# Patient Record
Sex: Female | Born: 1965 | Race: White | Hispanic: No | Marital: Married | Smoking: Never smoker
Health system: Southern US, Community
[De-identification: ages and names within clinical notes are randomized; demographics above are authoritative.]

## PROBLEM LIST (undated history)

## (undated) DIAGNOSIS — F419 Anxiety disorder, unspecified: Secondary | ICD-10-CM

---

## 2003-03-01 ENCOUNTER — Encounter: Admission: RE | Admit: 2003-03-01 | Discharge: 2003-03-01 | Payer: Self-pay | Admitting: Obstetrics & Gynecology

## 2005-10-01 ENCOUNTER — Encounter: Admission: RE | Admit: 2005-10-01 | Discharge: 2005-10-01 | Payer: Self-pay | Admitting: Obstetrics & Gynecology

## 2007-03-03 ENCOUNTER — Encounter: Admission: RE | Admit: 2007-03-03 | Discharge: 2007-03-03 | Payer: Self-pay | Admitting: Obstetrics and Gynecology

## 2007-03-09 ENCOUNTER — Encounter: Admission: RE | Admit: 2007-03-09 | Discharge: 2007-03-09 | Payer: Self-pay | Admitting: Obstetrics and Gynecology

## 2008-05-14 ENCOUNTER — Encounter: Admission: RE | Admit: 2008-05-14 | Discharge: 2008-05-14 | Payer: Self-pay | Admitting: Obstetrics & Gynecology

## 2010-01-26 ENCOUNTER — Encounter: Payer: Self-pay | Admitting: Obstetrics and Gynecology

## 2011-08-17 ENCOUNTER — Other Ambulatory Visit: Payer: Self-pay | Admitting: Obstetrics and Gynecology

## 2011-08-17 DIAGNOSIS — Z1231 Encounter for screening mammogram for malignant neoplasm of breast: Secondary | ICD-10-CM

## 2011-08-31 ENCOUNTER — Ambulatory Visit
Admission: RE | Admit: 2011-08-31 | Discharge: 2011-08-31 | Disposition: A | Discharge status: Home / Self Care | Payer: Private Health Insurance - Indemnity | Source: Ambulatory Visit | Attending: Obstetrics and Gynecology | Admitting: Obstetrics and Gynecology

## 2011-08-31 DIAGNOSIS — Z1231 Encounter for screening mammogram for malignant neoplasm of breast: Secondary | ICD-10-CM

## 2011-09-03 ENCOUNTER — Other Ambulatory Visit: Payer: Self-pay | Admitting: Obstetrics and Gynecology

## 2011-09-03 DIAGNOSIS — R928 Other abnormal and inconclusive findings on diagnostic imaging of breast: Secondary | ICD-10-CM

## 2011-09-09 ENCOUNTER — Ambulatory Visit
Admission: RE | Admit: 2011-09-09 | Discharge: 2011-09-09 | Disposition: A | Discharge status: Home / Self Care | Payer: Private Health Insurance - Indemnity | Source: Ambulatory Visit | Attending: Obstetrics and Gynecology | Admitting: Obstetrics and Gynecology

## 2011-09-09 DIAGNOSIS — R928 Other abnormal and inconclusive findings on diagnostic imaging of breast: Secondary | ICD-10-CM

## 2012-11-08 ENCOUNTER — Other Ambulatory Visit: Payer: Self-pay

## 2012-11-22 ENCOUNTER — Other Ambulatory Visit: Payer: Self-pay | Admitting: Obstetrics and Gynecology

## 2012-11-22 DIAGNOSIS — N6310 Unspecified lump in the right breast, unspecified quadrant: Secondary | ICD-10-CM

## 2012-12-12 ENCOUNTER — Other Ambulatory Visit: Payer: Private Health Insurance - Indemnity

## 2012-12-13 ENCOUNTER — Ambulatory Visit
Admission: RE | Admit: 2012-12-13 | Discharge: 2012-12-13 | Disposition: A | Discharge status: Home / Self Care | Payer: Private Health Insurance - Indemnity | Source: Ambulatory Visit | Attending: Obstetrics and Gynecology | Admitting: Obstetrics and Gynecology

## 2012-12-13 DIAGNOSIS — N6310 Unspecified lump in the right breast, unspecified quadrant: Secondary | ICD-10-CM

## 2014-12-22 IMAGING — US US BREAST*R*
1 series · 4 of 4 positions shown · non-contrast
Comparison: 08/31/2011, 05/14/2008, 03/03/2007

CLINICAL DATA: Palpable mass 12 o'clock position right breast

EXAM:
DIGITAL DIAGNOSTIC  BILATERAL MAMMOGRAM WITH CAD
ULTRASOUND RIGHT BREAST

[Series 1: us breast*right* · 4 of 4 slices shown]
[im 1/4]
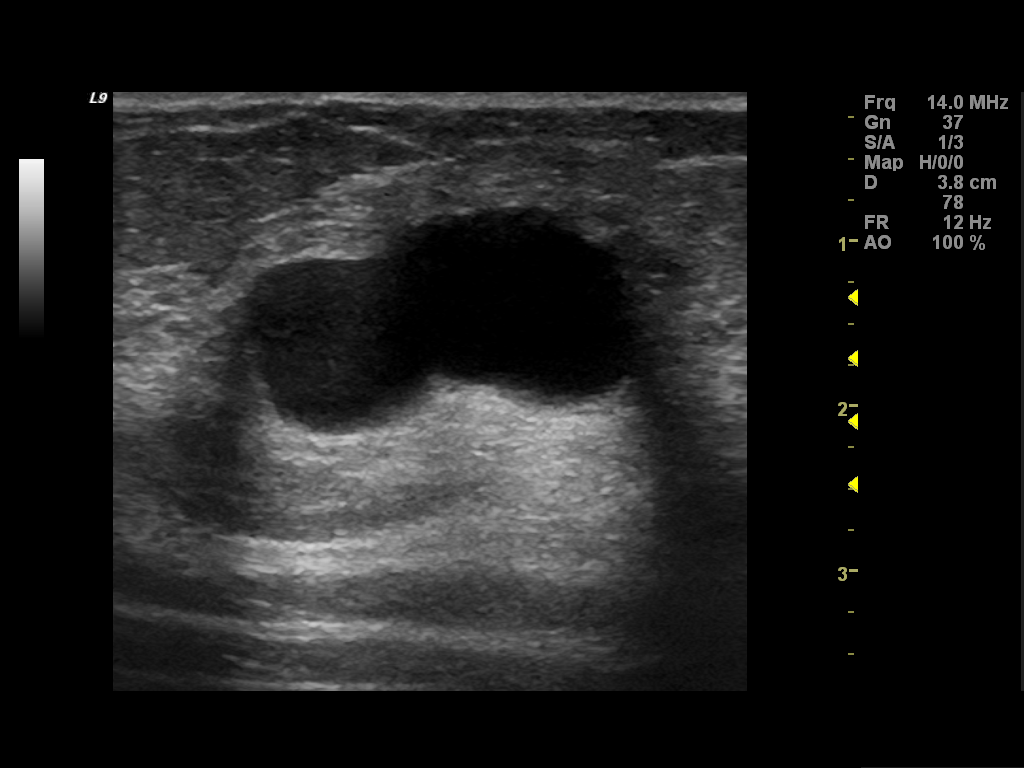
[im 2/4]
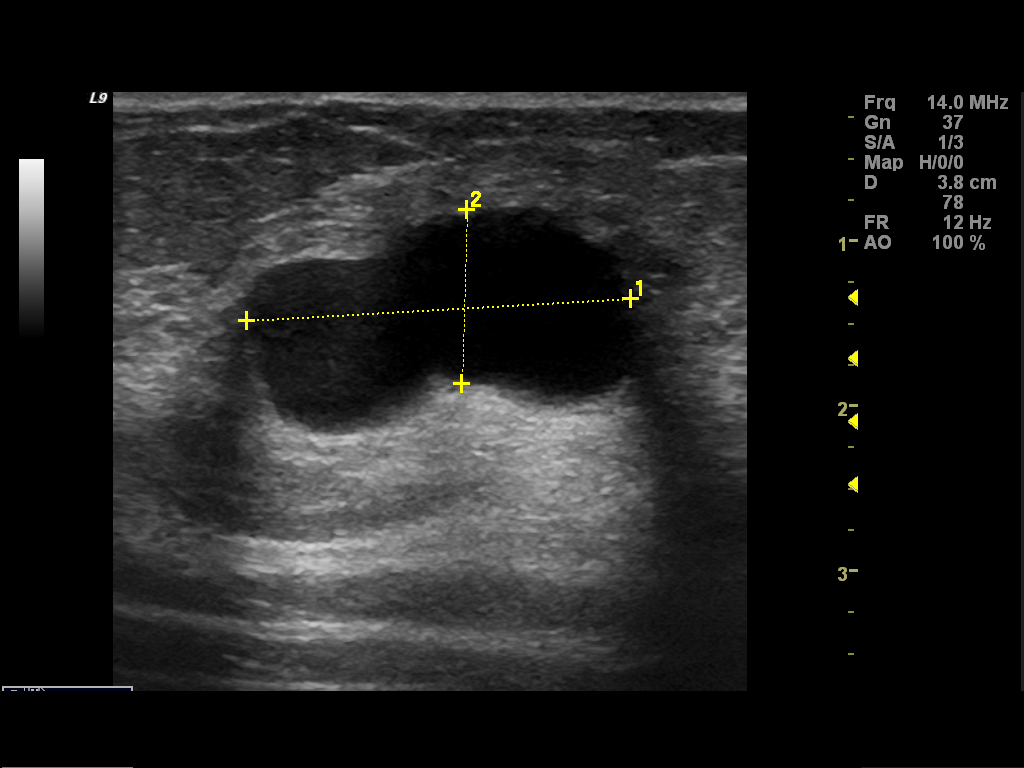
[im 3/4]
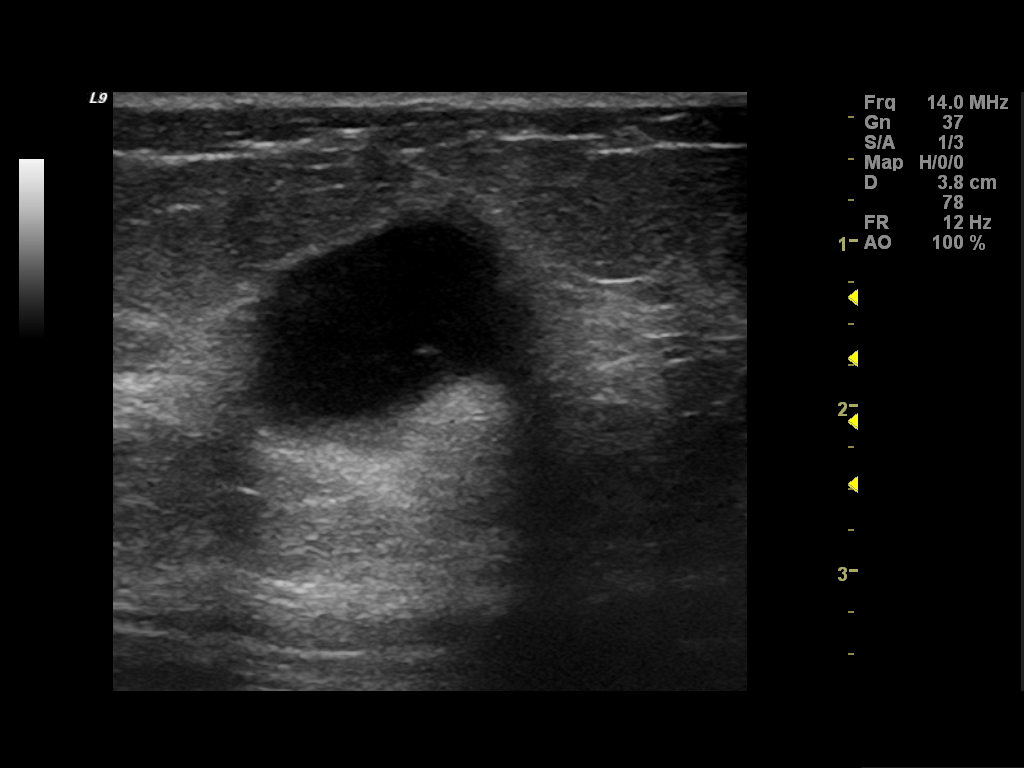
[im 4/4]
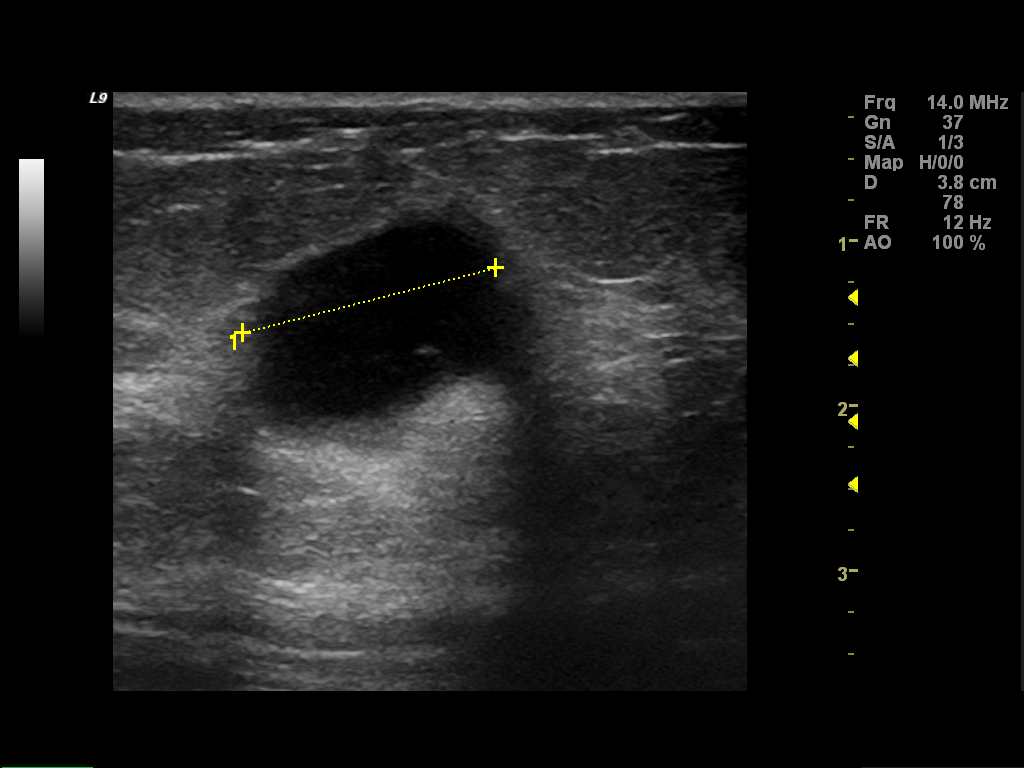

[4 of 4 positions shown; findings below may reference images not displayed]

ACR Breast Density Category c: The breast tissue is heterogeneously
dense, which may obscure small masses.
FINDINGS: Left breast is unchanged and negative. On the right, in the 12
o'clock position, corresponding to the palpable abnormality, there
is a 2 cm obscured round mass anteriorly.

Mammographic images were processed with CAD.

On physical exam,there is a palpable 2 cm man 4 cm from the nipple
in the 12 o'clock position of the right breast..

Ultrasound is performed, showing a mammographic mass and the
palpable abnormality corresponds to a simple cyst measuring 23 x 11
x 16 mm..
IMPRESSION: Simple cysts 12 o'clock position right breast

RECOMMENDATION:
Screening mammogram in 1 year

I have discussed the findings and recommendations with the patient.
Results were also provided in writing at the conclusion of the
visit.

BI-RADS CATEGORY  2: Benign Finding(s)

## 2015-11-14 DIAGNOSIS — Z1231 Encounter for screening mammogram for malignant neoplasm of breast: Secondary | ICD-10-CM | POA: Diagnosis not present

## 2015-12-03 DIAGNOSIS — Z01419 Encounter for gynecological examination (general) (routine) without abnormal findings: Secondary | ICD-10-CM | POA: Diagnosis not present

## 2016-03-10 DIAGNOSIS — Z30433 Encounter for removal and reinsertion of intrauterine contraceptive device: Secondary | ICD-10-CM | POA: Diagnosis not present

## 2016-03-10 DIAGNOSIS — Z3202 Encounter for pregnancy test, result negative: Secondary | ICD-10-CM | POA: Diagnosis not present

## 2016-12-28 NOTE — Progress Notes (Addendum)
[place] at [facility] [address] [place], [place] [address] [phone]  Date:  12/31/2016   Name:  Courtney Spence   DOB:  [birth year]   MRN:  [id number]  PCP:  Darreld Mclean, MD    Chief Complaint: No chief complaint on file.   History of Present Illness:  Courtney Spence is a 51 y.o. very pleasant female patient who presents with the following:  Here today as a new patient to establish care Few records in epic for this pt reviewed  She is not new to this area- she has been in good health and does not generally see a PCP She has had OBG care  She has 2 girls, 80 and college age. They are in good health and thriving. Her younger daughter does have asthma Her eldest had shoulder surgery from a soccer accident Married to her college sweetheart- he had a double lung transplant this past April at [facility] for ILD.  He is doing very well.  She has been his main caregiver over the last year. He was dx maybe 12 years ago but did pretty well until the last couple of years when his lungs started to get worse.    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    She does not have any major health issues  Her pap and mamm are UTD per her OBG She would like to have colon cancer screening- would like to do cologuard after discussion No recent routine labs- she does attend a health fair at work annually   She does not have any idea about the date of her last tetanus. It  Her flu is UTD She is fasting today  She does notice that she is a bit stressed out and anxious recently She did take some medication years ago per her OBG- we think an SSRI- she took this maybe 10 years ago for a year or so. It did seem to help her.  Her family esp noted that she seemed to feel better   She is sleeping ok Energy level seems good- she hopes to focus on losing some weight this year, and hopes that this will help with  her energy She does like to exercise.  She does not really feel depressed, but she is a bit overwhelmed No issues with SI   She has a mirena IUD- they just placed a new one for her There are no active problems to display for this patient.   History reviewed. No pertinent past medical history.    Social History   Tobacco Use  . Smoking status: Never Smoker  . Smokeless tobacco: Never Used  Substance Use Topics  . Alcohol use: Yes  . Drug use: No    History reviewed. No pertinent family history.  No Known Allergies  Medication list has been reviewed and updated.  No current outpatient medications on file prior to visit.   No current facility-administered medications on file prior to visit.     Review of Systems:  As per HPI- otherwise negative. No fever or chills No CP or SOB with exercise No rash   Physical Examination: Vitals:   12/31/16 0840  BP: 126/73  Pulse: 75  Resp: 16  Temp: 98.1 F (36.7 C)  SpO2: 99%   Vitals:   12/31/16 0840  Weight: 201 lb 6.4 oz (91.4 kg)  Height: '5\' 5"'$  (1.651  m)   Body mass index is 33.51 kg/m. Ideal Body Weight: Weight in (lb) to have BMI = 25: 149.9  GEN: WDWN, NAD, Non-toxic, A & O x 3, overweight, looks well  HEENT: Atraumatic, Normocephalic. Neck supple. No masses, No LAD.  Bilateral TM wnl, oropharynx normal.  PEERL,EOMI.   Ears and Nose: No external deformity. CV: RRR, No M/G/R. No JVD. No thrill. No extra heart sounds. PULM: CTA B, no wheezes, crackles, rhonchi. No retractions. No resp. distress. No accessory muscle use. ABD: S, NT, ND, +BS. No rebound. No HSM. EXTR: No c/c/e NEURO Normal gait.  PSYCH: Normally interactive. Conversant. Not depressed or anxious appearing.  Calm demeanor.    Assessment and Plan: Situational anxiety - Plan: FLUoxetine (PROZAC) 20 MG tablet  Immunization due - Plan: Tdap vaccine greater than or equal to 7yo IM  Screening for hyperlipidemia - Plan: Lipid panel  Screening  for diabetes mellitus - Plan: Comprehensive metabolic panel, Hemoglobin A1c  Screening for deficiency anemia - Plan: CBC  Colon cancer screening  Establishing care today Will try prozac for her anxiety, related to a very busy life and her husband's recent serious illness  It was nice to meet you today!  Take care and I will be in touch with your labs asap  You got your tdap vaccine today- you need to have this tetanus and pertussis booster once as an adult. You will be due for a regular tetanus vaccine in 10 years  You should receive a Cologuard kit to complete at home for colon cancer screening Please start on the prozac at 20 mg once a day.  After about 2 weeks you can increase to 40 mg.   Please come and see me or contact me via phone or mychart in 4-6 weeks to let me know how you are doing- sooner if any concerns   Will plan further follow- up pending labs.   Signed Lamar Blinks, MD  Received her labs 12/28   Your blood count is normal Metabolic profile is normal Your A1c (average blood sugar over the previous 3 months) is just at the cut off for pre-diabetes.  This means you may be at higher risk for developing diabetes later on.  We will want to keep an eye on this annually, and please do work on your diet and exercise routines as you had mentioned.    Your cholesterol is overall good- LDLD is slightly high but your HDL (good cholesterol) balances this.   It was very nice to meet you- take care and please update me about how the prozac is working in a few weeks  Results for orders placed or performed in visit on 12/31/16  CBC  Result Value Ref Range   WBC 9.9 4.0 - 10.5 K/uL   RBC 4.62 3.87 - 5.11 Mil/uL   Platelets 217.0 150.0 - 400.0 K/uL   Hemoglobin 14.0 12.0 - 15.0 g/dL   HCT 42.5 36.0 - 46.0 %   MCV 92.1 78.0 - 100.0 fl   MCHC 32.9 30.0 - 36.0 g/dL   RDW 13.2 11.5 - 15.5 %  Comprehensive metabolic panel  Result Value Ref Range   Sodium 137 135 - 145 mEq/L    Potassium 3.8 3.5 - 5.1 mEq/L   Chloride 105 96 - 112 mEq/L   CO2 25 19 - 32 mEq/L   Glucose, Bld 97 70 - 99 mg/dL   BUN 15 6 - 23 mg/dL   Creatinine, Ser 0.84 0.40 - 1.20  mg/dL   Total Bilirubin 0.7 0.2 - 1.2 mg/dL   Alkaline Phosphatase 56 39 - 117 U/L   AST 14 0 - 37 U/L   ALT 14 0 - 35 U/L   Total Protein 7.2 6.0 - 8.3 g/dL   Albumin 4.2 3.5 - 5.2 g/dL   Calcium 8.9 8.4 - 10.5 mg/dL   GFR 75.91 >60.00 mL/min  Hemoglobin A1c  Result Value Ref Range   Hgb A1c MFr Bld 5.7 4.6 - 6.5 %  Lipid panel  Result Value Ref Range   Cholesterol 202 (H) 0 - 200 mg/dL   Triglycerides 147.0 0.0 - 149.0 mg/dL   HDL 50.10 >39.00 mg/dL   VLDL 29.4 0.0 - 40.0 mg/dL   LDL Cholesterol 122 (H) 0 - 99 mg/dL   Total CHOL/HDL Ratio 4    NonHDL 151.89    Message to pt

## 2016-12-31 ENCOUNTER — Encounter: Payer: Self-pay | Admitting: Family Medicine

## 2016-12-31 ENCOUNTER — Ambulatory Visit (INDEPENDENT_AMBULATORY_CARE_PROVIDER_SITE_OTHER): Payer: BLUE CROSS/BLUE SHIELD | Admitting: Family Medicine

## 2016-12-31 VITALS — BP 126/73 | HR 75 | Temp 98.1°F | Resp 16 | Ht 65.0 in | Wt 201.4 lb

## 2016-12-31 DIAGNOSIS — Z13220 Encounter for screening for lipoid disorders: Secondary | ICD-10-CM

## 2016-12-31 DIAGNOSIS — Z1211 Encounter for screening for malignant neoplasm of colon: Secondary | ICD-10-CM | POA: Diagnosis not present

## 2016-12-31 DIAGNOSIS — F418 Other specified anxiety disorders: Secondary | ICD-10-CM | POA: Diagnosis not present

## 2016-12-31 DIAGNOSIS — Z130 Encounter for screening for diseases of the blood and blood-forming organs and certain disorders involving the immune mechanism: Secondary | ICD-10-CM | POA: Diagnosis not present

## 2016-12-31 DIAGNOSIS — Z131 Encounter for screening for diabetes mellitus: Secondary | ICD-10-CM | POA: Diagnosis not present

## 2016-12-31 DIAGNOSIS — Z23 Encounter for immunization: Secondary | ICD-10-CM

## 2016-12-31 LAB — LIPID PANEL
CHOLESTEROL: 202 mg/dL — AB (ref 0–200)
HDL: 50.1 mg/dL (ref >39.00)
LDL Cholesterol: 122 mg/dL — ABNORMAL HIGH (ref 0–99)
NonHDL: 151.89
TRIGLYCERIDES: 147 mg/dL (ref 0.0–149.0)
Total CHOL/HDL Ratio: 4
VLDL: 29.4 mg/dL (ref 0.0–40.0)

## 2016-12-31 LAB — COMPREHENSIVE METABOLIC PANEL
ALK PHOS: 56 U/L (ref 39–117)
ALT: 14 U/L (ref 0–35)
AST: 14 U/L (ref 0–37)
Albumin: 4.2 g/dL (ref 3.5–5.2)
BILIRUBIN TOTAL: 0.7 mg/dL (ref 0.2–1.2)
BUN: 15 mg/dL (ref 6–23)
CALCIUM: 8.9 mg/dL (ref 8.4–10.5)
CO2: 25 mEq/L (ref 19–32)
Chloride: 105 mEq/L (ref 96–112)
Creatinine, Ser: 0.84 mg/dL (ref 0.40–1.20)
GFR: 75.91 mL/min (ref >60.00)
Glucose, Bld: 97 mg/dL (ref 70–99)
Potassium: 3.8 mEq/L (ref 3.5–5.1)
Sodium: 137 mEq/L (ref 135–145)
TOTAL PROTEIN: 7.2 g/dL (ref 6.0–8.3)

## 2016-12-31 LAB — HEMOGLOBIN A1C: HEMOGLOBIN A1C: 5.7 % (ref 4.6–6.5)

## 2016-12-31 MED ORDER — FLUOXETINE HCL 20 MG PO TABS: 20.0000 mg | ORAL_TABLET | Freq: Every day | ORAL | 5 refills | Status: DC

## 2016-12-31 NOTE — Patient Instructions (Addendum)
It was nice to meet you today!  Take care and I will be in touch with your labs asap  You got your tdap vaccine today- you need to have this tetanus and pertussis booster once as an adult. You will be due for a regular tetanus vaccine in 10 years  You should receive a Cologuard kit to complete at home for colon cancer screening Please start on the prozac at 20 mg once a day.  After about 2 weeks you can increase to 40 mg.   Please come and see me or contact me via phone or mychart in 4-6 weeks to let me know how you are doing- sooner if any concerns

## 2017-01-01 ENCOUNTER — Telehealth: Payer: Self-pay | Admitting: *Deleted

## 2017-01-01 ENCOUNTER — Encounter: Payer: Self-pay | Admitting: Family Medicine

## 2017-01-01 ENCOUNTER — Other Ambulatory Visit: Payer: BLUE CROSS/BLUE SHIELD

## 2017-01-01 LAB — CBC
HCT: 42.5 % (ref 36.0–46.0)
HEMOGLOBIN: 14 g/dL (ref 12.0–15.0)
MCHC: 32.9 g/dL (ref 30.0–36.0)
MCV: 92.1 fl (ref 78.0–100.0)
PLATELETS: 217 10*3/uL (ref 150.0–400.0)
RBC: 4.62 Mil/uL (ref 3.87–5.11)
RDW: 13.2 % (ref 11.5–15.5)
WBC: 9.9 10*3/uL (ref 4.0–10.5)

## 2017-01-01 NOTE — Telephone Encounter (Signed)
Received Cologuard Incomplete Order: [id number]; order has been faxed back d/t missing information necessary to send patient a Cologuard kit. [employer] has made several attempts to reach patient by phone and has not been able to reach them; forwarded to provider/SLS 12/28

## 2017-01-06 ENCOUNTER — Encounter: Payer: Self-pay | Admitting: Family Medicine

## 2017-01-07 ENCOUNTER — Telehealth: Payer: Self-pay | Admitting: *Deleted

## 2017-01-07 NOTE — Telephone Encounter (Signed)
Received Cologuard Incomplete Order: [id number]; order has been faxed back d/t missing information necessary to send patient a Cologuard kit. [employer] has made several attempts to reach patient by phone and has not been able to reach them; forwarded to provider/SLS 01/03

## 2017-01-29 ENCOUNTER — Encounter: Payer: Self-pay | Admitting: Family Medicine

## 2017-01-29 LAB — COLOGUARD: Cologuard: NEGATIVE

## 2017-02-03 ENCOUNTER — Encounter: Payer: Self-pay | Admitting: Family Medicine

## 2017-02-03 DIAGNOSIS — F418 Other specified anxiety disorders: Secondary | ICD-10-CM

## 2017-02-03 MED ORDER — FLUOXETINE HCL 20 MG PO TABS: 20.0000 mg | ORAL_TABLET | Freq: Every day | ORAL | 3 refills | Status: DC

## 2017-02-15 ENCOUNTER — Telehealth: Payer: Self-pay | Admitting: Family Medicine

## 2017-02-15 NOTE — Telephone Encounter (Signed)
Copied from CRM [id number]. Topic: Quick Communication - See Telephone Encounter >> Feb 15, 2017  3:28 PM Valentina LucksMatos, Jackelin wrote: CRM for notification. See Telephone encounter for:  02/15/17.   Pt dropped off document to be filled out by provider (document for insurance purpose - in a small white envelope) When document ready pt would like to have it faxed to [phone]. Document put at front office tray under providers name.

## 2017-02-17 ENCOUNTER — Encounter: Payer: Self-pay | Admitting: Family Medicine

## 2017-02-17 NOTE — Telephone Encounter (Signed)
Filled out Physician's Results Form from patient's chart; forwarded to provider for signature/SLS 02/13

## 2017-04-10 ENCOUNTER — Encounter: Payer: Self-pay | Admitting: Family Medicine

## 2017-04-10 DIAGNOSIS — B360 Pityriasis versicolor: Secondary | ICD-10-CM

## 2017-04-10 DIAGNOSIS — Z13220 Encounter for screening for lipoid disorders: Secondary | ICD-10-CM

## 2017-04-10 DIAGNOSIS — Z131 Encounter for screening for diabetes mellitus: Secondary | ICD-10-CM

## 2017-04-12 MED ORDER — KETOCONAZOLE 2 % EX SHAM: 1.0000 "application " | MEDICATED_SHAMPOO | CUTANEOUS | 0 refills | Status: DC

## 2017-04-28 ENCOUNTER — Encounter: Payer: Self-pay | Admitting: Family Medicine

## 2017-08-23 DIAGNOSIS — Z1231 Encounter for screening mammogram for malignant neoplasm of breast: Secondary | ICD-10-CM | POA: Diagnosis not present

## 2017-12-25 NOTE — Progress Notes (Addendum)
[facility] [address] [place], [place] [address] [phone]  Date:  12/27/2017   Name:  Courtney Spence   DOB:  January 06, 1965   MRN:  [id number]  PCP:  Pearline Cables, MD    Chief Complaint: Annual Exam (would like blood work, declined pap)   History of Present Illness:  Courtney Spence is a 52 y.o. very pleasant female patient who presents with the following:  Here today for complete physical Last seen by myself about 1 year ago Her husband is status post double lung transplant for interstitial lung disease.  At our last visit she admitted to some stress and anxiety, and we started fluoxetine 20 mg, she increased to 40 mg a day  Her husband is 18 months out from his operation and is doing so well.  He is thinking of getting back to work full time  She is sent me a follow-up note after our visit, let me know the Prozac was working She wishes to continue taking this and it is working well  Pap:  Per her GYN, this is UTD Mammo: per her GYN  Flu: done  Labs: due for complete.  Fasting today Colon: did cologuard last year  Shingrix:   Pt has a form for work for her insurance but did not bring it in-I will fill it out for her at a later date Waist measurement:  32.5 in  She is going to a boot camp fitness program and tries to go 5 days a week Wt Readings from Last 3 Encounters:  12/27/17 181 lb (82.1 kg)  12/31/16 201 lb 6.4 oz (91.4 kg)   She has lost about 20 lbs  Never a smoker Rare alcohol No CP or SOB  She feels like she is doing quite well on the Prozac.  She is currently taking 40 mg.  Her mood symptoms seem to be under good control, but her husband does notice that she has a decreased sex drive.  We discussed this, and thought about changing to a different medication.  However as she is overall happy with Prozac, we will have her try decreasing to 20 mg.  She will let me know how this works for her.  Her  children are 47 and 73 yo- one is a Fish farm manager and one is a Holiday representative in [employer] There are no active problems to display for this patient.   No past medical history on file.  No past surgical history on file.  Social History   Tobacco Use  . Smoking status: Never Smoker  . Smokeless tobacco: Never Used  Substance Use Topics  . Alcohol use: Yes  . Drug use: No    No family history on file.  No Known Allergies  Medication list has been reviewed and updated.  Current Outpatient Medications on File Prior to Visit  Medication Sig Dispense Refill  . FLUoxetine (PROZAC) 20 MG tablet Take 1 tablet (20 mg total) by mouth daily. May increase to 40 mg after 2 weeks 180 tablet 3  . ketoconazole (NIZORAL) 2 % shampoo Apply 1 application topically 2 (two) times a week. 120 mL 0  . levonorgestrel (MIRENA) 20 MCG/24HR IUD by Intrauterine route.     No current facility-administered medications on file prior to visit.     Review of Systems:  As per HPI- otherwise negative. No fever or chills No CP or SOB    Physical Examination: Vitals:  12/27/17 0857  BP: 122/78  Pulse: 65  Resp: 16  SpO2: 98%   Vitals:   12/27/17 0857  Weight: 181 lb (82.1 kg)  Height: 5\' 5"  (1.651 m)   Body mass index is 30.12 kg/m. Ideal Body Weight: Weight in (lb) to have BMI = 25: 149.9  GEN: WDWN, NAD, Non-toxic, A & O x 3, mild overweight, looks well  HEENT: Atraumatic, Normocephalic. Neck supple. No masses, No LAD.  Bilateral TM wnl, oropharynx normal.  PEERL,EOMI.   Ears and Nose: No external deformity. CV: RRR, No M/G/R. No JVD. No thrill. No extra heart sounds. PULM: CTA B, no wheezes, crackles, rhonchi. No retractions. No resp. distress. No accessory muscle use. ABD: S, NT, ND, +BS. No rebound. No HSM. EXTR: No c/c/e NEURO Normal gait.  PSYCH: Normally interactive. Conversant. Not depressed or anxious appearing.  Calm demeanor.    Assessment and Plan: Physical exam  Screening for  diabetes mellitus - Plan: Comprehensive metabolic panel, Hemoglobin A1c  Screening for hyperlipidemia - Plan: Lipid panel  Screening for deficiency anemia - Plan: CBC  Situational anxiety - Plan: FLUoxetine (PROZAC) 20 MG tablet  Lawson FiscalLori is here today for physical exam.  Overall things are going very well, her children are well and her husband has done great after his lung transplant. She has lost about 20 pounds through diet and exercise. She does plan to try decreasing her fluoxetine to 20 mg, and will let me know how this works for her. Continue to see GYN for Pap and mammogram Discussed Shingrix, she will hold off for today but likely will get later. I will be in touch with her labs ASAP  Signed Abbe AmsterdamJessica , MD  Received her labs, message to pt    Metabolic profile is normal. Hemoglobin A1c, average blood sugar over the previous 3 months, does not show diabetes Cholesterol looks fine, with these numbers your 10-year estimated risk of cardiovascular disease is 1.2%-see below  Results for orders placed or performed in visit on 12/27/17  Comprehensive metabolic panel  Result Value Ref Range   Sodium 139 135 - 145 mEq/L   Potassium 4.5 3.5 - 5.1 mEq/L   Chloride 102 96 - 112 mEq/L   CO2 30 19 - 32 mEq/L   Glucose, Bld 85 70 - 99 mg/dL   BUN 13 6 - 23 mg/dL   Creatinine, Ser 4.540.87 0.40 - 1.20 mg/dL   Total Bilirubin 0.4 0.2 - 1.2 mg/dL   Alkaline Phosphatase 52 39 - 117 U/L   AST 17 0 - 37 U/L   ALT 20 0 - 35 U/L   Total Protein 6.8 6.0 - 8.3 g/dL   Albumin 4.3 3.5 - 5.2 g/dL   Calcium 9.5 8.4 - 09.810.5 mg/dL   GFR 11.9172.62 >47.82>60.00 mL/min  Hemoglobin A1c  Result Value Ref Range   Hgb A1c MFr Bld 5.6 4.6 - 6.5 %  Lipid panel  Result Value Ref Range   Cholesterol 206 (H) 0 - 200 mg/dL   Triglycerides 956.2104.0 0.0 - 149.0 mg/dL   HDL 13.0862.70 >65.78>39.00 mg/dL   VLDL 46.920.8 0.0 - 62.940.0 mg/dL   LDL Cholesterol 528122 (H) 0 - 99 mg/dL   Total CHOL/HDL Ratio 3    NonHDL 143.14    The 10-year  ASCVD risk score Denman George(Goff DC Jr., et al., 2013) is: 1.2%   Values used to calculate the score:     Age: [age] years     Sex: Female     Is  Non-Hispanic African American: No     Diabetic: No     Tobacco smoker: No     Systolic Blood Pressure: 122 mmHg     Is BP treated: No     HDL Cholesterol: 62.7 mg/dL     Total Cholesterol: 206 mg/dL

## 2017-12-27 ENCOUNTER — Encounter: Payer: Self-pay | Admitting: Family Medicine

## 2017-12-27 ENCOUNTER — Ambulatory Visit (INDEPENDENT_AMBULATORY_CARE_PROVIDER_SITE_OTHER): Payer: BLUE CROSS/BLUE SHIELD | Admitting: Family Medicine

## 2017-12-27 VITALS — BP 122/78 | HR 65 | Resp 16 | Ht 65.0 in | Wt 181.0 lb

## 2017-12-27 DIAGNOSIS — Z0000 Encounter for general adult medical examination without abnormal findings: Secondary | ICD-10-CM

## 2017-12-27 DIAGNOSIS — Z130 Encounter for screening for diseases of the blood and blood-forming organs and certain disorders involving the immune mechanism: Secondary | ICD-10-CM

## 2017-12-27 DIAGNOSIS — F418 Other specified anxiety disorders: Secondary | ICD-10-CM

## 2017-12-27 DIAGNOSIS — Z13220 Encounter for screening for lipoid disorders: Secondary | ICD-10-CM

## 2017-12-27 DIAGNOSIS — Z131 Encounter for screening for diabetes mellitus: Secondary | ICD-10-CM | POA: Diagnosis not present

## 2017-12-27 LAB — COMPREHENSIVE METABOLIC PANEL
ALBUMIN: 4.3 g/dL (ref 3.5–5.2)
ALT: 20 U/L (ref 0–35)
AST: 17 U/L (ref 0–37)
Alkaline Phosphatase: 52 U/L (ref 39–117)
BUN: 13 mg/dL (ref 6–23)
CO2: 30 meq/L (ref 19–32)
CREATININE: 0.87 mg/dL (ref 0.40–1.20)
Calcium: 9.5 mg/dL (ref 8.4–10.5)
Chloride: 102 mEq/L (ref 96–112)
GFR: 72.62 mL/min (ref >60.00)
GLUCOSE: 85 mg/dL (ref 70–99)
POTASSIUM: 4.5 meq/L (ref 3.5–5.1)
Sodium: 139 mEq/L (ref 135–145)
Total Bilirubin: 0.4 mg/dL (ref 0.2–1.2)
Total Protein: 6.8 g/dL (ref 6.0–8.3)

## 2017-12-27 LAB — LIPID PANEL
CHOL/HDL RATIO: 3
Cholesterol: 206 mg/dL — ABNORMAL HIGH (ref 0–200)
HDL: 62.7 mg/dL (ref >39.00)
LDL Cholesterol: 122 mg/dL — ABNORMAL HIGH (ref 0–99)
NonHDL: 143.14
Triglycerides: 104 mg/dL (ref 0.0–149.0)
VLDL: 20.8 mg/dL (ref 0.0–40.0)

## 2017-12-27 LAB — HEMOGLOBIN A1C: Hgb A1c MFr Bld: 5.6 % (ref 4.6–6.5)

## 2017-12-27 MED ORDER — FLUOXETINE HCL 20 MG PO TABS: 20.0000 mg | ORAL_TABLET | Freq: Every day | ORAL | 3 refills | Status: DC

## 2017-12-27 NOTE — Addendum Note (Signed)
Addended by: Crissie SicklesARTER, Kambryn Dapolito A on: 12/27/2017 02:05 PM   Modules accepted: Orders

## 2017-12-27 NOTE — Patient Instructions (Signed)
It was very nice to see you today, if you have a wonderful holiday season. I will be in touch with your labs ASAP.  We can complete your form once it is available. If you would like, try cutting your Prozac back to 1 pill a day.  Let me know how this works for you. Congratulations on your excellent progress with exercise and weight loss!  Health Maintenance, Female Adopting a healthy lifestyle and getting preventive care can go a long way to promote health and wellness. Talk with your health care provider about what schedule of regular examinations is right for you. This is a good chance for you to check in with your provider about disease prevention and staying healthy. In between checkups, there are plenty of things you can do on your own. Experts have done a lot of research about which lifestyle changes and preventive measures are most likely to keep you healthy. Ask your health care provider for more information. Weight and diet Eat a healthy diet  Be sure to include plenty of vegetables, fruits, low-fat dairy products, and lean protein.  Do not eat a lot of foods high in solid fats, added sugars, or salt.  Get regular exercise. This is one of the most important things you can do for your health. ? Most adults should exercise for at least 150 minutes each week. The exercise should increase your heart rate and make you sweat (moderate-intensity exercise). ? Most adults should also do strengthening exercises at least twice a week. This is in addition to the moderate-intensity exercise. Maintain a healthy weight  Body mass index (BMI) is a measurement that can be used to identify possible weight problems. It estimates body fat based on height and weight. Your health care provider can help determine your BMI and help you achieve or maintain a healthy weight.  For females 44 years of age and older: ? A BMI below 18.5 is considered underweight. ? A BMI of 18.5 to 24.9 is normal. ? A BMI of 25  to 29.9 is considered overweight. ? A BMI of 30 and above is considered obese. Watch levels of cholesterol and blood lipids  You should start having your blood tested for lipids and cholesterol at 52 years of age, then have this test every 5 years.  You may need to have your cholesterol levels checked more often if: ? Your lipid or cholesterol levels are high. ? You are older than 52 years of age. ? You are at high risk for heart disease. Cancer screening Lung Cancer  Lung cancer screening is recommended for adults 74-17 years old who are at high risk for lung cancer because of a history of smoking.  A yearly low-dose CT scan of the lungs is recommended for people who: ? Currently smoke. ? Have quit within the past 15 years. ? Have at least a 30-pack-year history of smoking. A pack year is smoking an average of one pack of cigarettes a day for 1 year.  Yearly screening should continue until it has been 15 years since you quit.  Yearly screening should stop if you develop a health problem that would prevent you from having lung cancer treatment. Breast Cancer  Practice breast self-awareness. This means understanding how your breasts normally appear and feel.  It also means doing regular breast self-exams. Let your health care provider know about any changes, no matter how small.  If you are in your 20s or 30s, you should have a clinical breast  exam (CBE) by a health care provider every 1-3 years as part of a regular health exam.  If you are 28 or older, have a CBE every year. Also consider having a breast X-ray (mammogram) every year.  If you have a family history of breast cancer, talk to your health care provider about genetic screening.  If you are at high risk for breast cancer, talk to your health care provider about having an MRI and a mammogram every year.  Breast cancer gene (BRCA) assessment is recommended for women who have family members with BRCA-related cancers.  BRCA-related cancers include: ? Breast. ? Ovarian. ? Tubal. ? Peritoneal cancers.  Results of the assessment will determine the need for genetic counseling and BRCA1 and BRCA2 testing. Cervical Cancer Your health care provider may recommend that you be screened regularly for cancer of the pelvic organs (ovaries, uterus, and vagina). This screening involves a pelvic examination, including checking for microscopic changes to the surface of your cervix (Pap test). You may be encouraged to have this screening done every 3 years, beginning at age 67.  For women ages 57-65, health care providers may recommend pelvic exams and Pap testing every 3 years, or they may recommend the Pap and pelvic exam, combined with testing for human papilloma virus (HPV), every 5 years. Some types of HPV increase your risk of cervical cancer. Testing for HPV may also be done on women of any age with unclear Pap test results.  Other health care providers may not recommend any screening for nonpregnant women who are considered low risk for pelvic cancer and who do not have symptoms. Ask your health care provider if a screening pelvic exam is right for you.  If you have had past treatment for cervical cancer or a condition that could lead to cancer, you need Pap tests and screening for cancer for at least 20 years after your treatment. If Pap tests have been discontinued, your risk factors (such as having a new sexual partner) need to be reassessed to determine if screening should resume. Some women have medical problems that increase the chance of getting cervical cancer. In these cases, your health care provider may recommend more frequent screening and Pap tests. Colorectal Cancer  This type of cancer can be detected and often prevented.  Routine colorectal cancer screening usually begins at 52 years of age and continues through 52 years of age.  Your health care provider may recommend screening at an earlier age if you  have risk factors for colon cancer.  Your health care provider may also recommend using home test kits to check for hidden blood in the stool.  A small camera at the end of a tube can be used to examine your colon directly (sigmoidoscopy or colonoscopy). This is done to check for the earliest forms of colorectal cancer.  Routine screening usually begins at age 59.  Direct examination of the colon should be repeated every 5-10 years through 52 years of age. However, you may need to be screened more often if early forms of precancerous polyps or small growths are found. Skin Cancer  Check your skin from head to toe regularly.  Tell your health care provider about any new moles or changes in moles, especially if there is a change in a mole's shape or color.  Also tell your health care provider if you have a mole that is larger than the size of a pencil eraser.  Always use sunscreen. Apply sunscreen liberally and repeatedly  throughout the day.  Protect yourself by wearing long sleeves, pants, a wide-brimmed hat, and sunglasses whenever you are outside. Heart disease, diabetes, and high blood pressure  High blood pressure causes heart disease and increases the risk of stroke. High blood pressure is more likely to develop in: ? People who have blood pressure in the high end of the normal range (130-139/85-89 mm Hg). ? People who are overweight or obese. ? People who are African American.  If you are 105-58 years of age, have your blood pressure checked every 3-5 years. If you are 90 years of age or older, have your blood pressure checked every year. You should have your blood pressure measured twice-once when you are at a hospital or clinic, and once when you are not at a hospital or clinic. Record the average of the two measurements. To check your blood pressure when you are not at a hospital or clinic, you can use: ? An automated blood pressure machine at a pharmacy. ? A home blood pressure  monitor.  If you are between 18 years and 53 years old, ask your health care provider if you should take aspirin to prevent strokes.  Have regular diabetes screenings. This involves taking a blood sample to check your fasting blood sugar level. ? If you are at a normal weight and have a low risk for diabetes, have this test once every three years after 52 years of age. ? If you are overweight and have a high risk for diabetes, consider being tested at a younger age or more often. Preventing infection Hepatitis B  If you have a higher risk for hepatitis B, you should be screened for this virus. You are considered at high risk for hepatitis B if: ? You were born in a country where hepatitis B is common. Ask your health care provider which countries are considered high risk. ? Your parents were born in a high-risk country, and you have not been immunized against hepatitis B (hepatitis B vaccine). ? You have HIV or AIDS. ? You use needles to inject street drugs. ? You live with someone who has hepatitis B. ? You have had sex with someone who has hepatitis B. ? You get hemodialysis treatment. ? You take certain medicines for conditions, including cancer, organ transplantation, and autoimmune conditions. Hepatitis C  Blood testing is recommended for: ? Everyone born from [birth year] through 1965. ? Anyone with known risk factors for hepatitis C. Sexually transmitted infections (STIs)  You should be screened for sexually transmitted infections (STIs) including gonorrhea and chlamydia if: ? You are sexually active and are younger than 52 years of age. ? You are older than 52 years of age and your health care provider tells you that you are at risk for this type of infection. ? Your sexual activity has changed since you were last screened and you are at an increased risk for chlamydia or gonorrhea. Ask your health care provider if you are at risk.  If you do not have HIV, but are at risk, it may be  recommended that you take a prescription medicine daily to prevent HIV infection. This is called pre-exposure prophylaxis (PrEP). You are considered at risk if: ? You are sexually active and do not regularly use condoms or know the HIV status of your partner(s). ? You take drugs by injection. ? You are sexually active with a partner who has HIV. Talk with your health care provider about whether you are at high risk of  being infected with HIV. If you choose to begin PrEP, you should first be tested for HIV. You should then be tested every 3 months for as long as you are taking PrEP. Pregnancy  If you are premenopausal and you may become pregnant, ask your health care provider about preconception counseling.  If you may become pregnant, take 400 to 800 micrograms (mcg) of folic acid every day.  If you want to prevent pregnancy, talk to your health care provider about birth control (contraception). Osteoporosis and menopause  Osteoporosis is a disease in which the bones lose minerals and strength with aging. This can result in serious bone fractures. Your risk for osteoporosis can be identified using a bone density scan.  If you are 77 years of age or older, or if you are at risk for osteoporosis and fractures, ask your health care provider if you should be screened.  Ask your health care provider whether you should take a calcium or vitamin D supplement to lower your risk for osteoporosis.  Menopause may have certain physical symptoms and risks.  Hormone replacement therapy may reduce some of these symptoms and risks. Talk to your health care provider about whether hormone replacement therapy is right for you. Follow these instructions at home:  Schedule regular health, dental, and eye exams.  Stay current with your immunizations.  Do not use any tobacco products including cigarettes, chewing tobacco, or electronic cigarettes.  If you are pregnant, do not drink alcohol.  If you are  breastfeeding, limit how much and how often you drink alcohol.  Limit alcohol intake to no more than 1 drink per day for nonpregnant women. One drink equals 12 ounces of beer, 5 ounces of wine, or 1 ounces of hard liquor.  Do not use street drugs.  Do not share needles.  Ask your health care provider for help if you need support or information about quitting drugs.  Tell your health care provider if you often feel depressed.  Tell your health care provider if you have ever been abused or do not feel safe at home. This information is not intended to replace advice given to you by your health care provider. Make sure you discuss any questions you have with your health care provider. Document Released: 07/07/2010 Document Revised: 05/30/2015 Document Reviewed: 09/25/2014 Elsevier Interactive Patient Education  2019 [employer].

## 2017-12-28 ENCOUNTER — Other Ambulatory Visit: Payer: BLUE CROSS/BLUE SHIELD

## 2018-12-26 DIAGNOSIS — Z20828 Contact with and (suspected) exposure to other viral communicable diseases: Secondary | ICD-10-CM | POA: Diagnosis not present

## 2018-12-26 DIAGNOSIS — Z1159 Encounter for screening for other viral diseases: Secondary | ICD-10-CM | POA: Diagnosis not present

## 2019-01-15 NOTE — Progress Notes (Deleted)
[facility] at [facility] [address] [place], [place] [address] [phone]  Date:  01/18/2019   Name:  Courtney Spence   DOB:  12/13/1965   MRN:  [id number]  PCP:  Pearline Cables, MD    Chief Complaint: No chief complaint on file.   History of Present Illness:  Courtney Spence is a 54 y.o. very pleasant female patient who presents with the following:  Here today for routine follow-up visit Last seen by myself about a year ago She has history of situational anxiety-her husband had a double lung transplant for interstitial lung disease, which has been tough on her as expected At her last visit she was taking fluoxetine with good results She also had lost about 20 pounds through a [employer] program Her children are 22 and 17  Pap Mammogram Flu vaccine Labs-due for routine blood work today There are no problems to display for this patient.   No past medical history on file.  No past surgical history on file.  Social History   Tobacco Use  . Smoking status: Never Smoker  . Smokeless tobacco: Never Used  Substance Use Topics  . Alcohol use: Yes  . Drug use: No    No family history on file.  No Known Allergies  Medication list has been reviewed and updated.  Current Outpatient Medications on File Prior to Visit  Medication Sig Dispense Refill  . FLUoxetine (PROZAC) 20 MG tablet Take 1 tablet (20 mg total) by mouth daily. May increase to 40 mg after 2 weeks 180 tablet 3  . ketoconazole (NIZORAL) 2 % shampoo Apply 1 application topically 2 (two) times a week. 120 mL 0  . levonorgestrel (MIRENA) 20 MCG/24HR IUD by Intrauterine route.     No current facility-administered medications on file prior to visit.    Review of Systems:  As per HPI- otherwise negative.   Physical Examination: There were no vitals filed for this visit. There were no vitals filed for this visit. There is no height or weight on  file to calculate BMI. Ideal Body Weight:    GEN: WDWN, NAD, Non-toxic, A & O x 3 HEENT: Atraumatic, Normocephalic. Neck supple. No masses, No LAD. Ears and Nose: No external deformity. CV: RRR, No M/G/R. No JVD. No thrill. No extra heart sounds. PULM: CTA B, no wheezes, crackles, rhonchi. No retractions. No resp. distress. No accessory muscle use. ABD: S, NT, ND, +BS. No rebound. No HSM. EXTR: No c/c/e NEURO Normal gait.  PSYCH: Normally interactive. Conversant. Not depressed or anxious appearing.  Calm demeanor.    Assessment and Plan: ***  Signed Abbe Amsterdam, MD

## 2019-01-18 ENCOUNTER — Encounter: Payer: BLUE CROSS/BLUE SHIELD | Admitting: Family Medicine

## 2019-02-21 ENCOUNTER — Other Ambulatory Visit: Payer: Self-pay

## 2019-02-21 ENCOUNTER — Other Ambulatory Visit: Payer: Self-pay | Admitting: Family Medicine

## 2019-02-21 DIAGNOSIS — F418 Other specified anxiety disorders: Secondary | ICD-10-CM

## 2019-02-21 NOTE — Progress Notes (Addendum)
[facility] at [facility] [address] [place], [place] [address] [phone]  Date:  02/23/2019   Name:  Courtney Spence   DOB:  [birth year]   MRN:  [id number]  PCP:  Darreld Mclean, MD    Chief Complaint: No chief complaint on file.   History of Present Illness:  Courtney Spence is a 54 y.o. very pleasant female patient who presents with the following:  Generally healthy woman here today for routine physical Virtual visit today due to inclement weather.  Patient is at home, I am also at home.  Patient identity version 2 factors, she gives consent for virtual visit today.  The patient and myself are present on the call Last seen by myself December 2019 At that time her husband had completed a double lung transplant for interstitial lung disease, he was 18 months out and doing okay.  He continues to do well. He is now nearly 3 years out.  We had her on fluoxetine for stress and anxiety- she is taking this still and likes it.  She would like to continue her current dose of 40 mg a day  She has 2 children- now 22 and just graduated from [employer] now at home looking at [employer], and a HS senior and hoping to attend [employer] and play soccer Most recent labs December 19, update today  She does have a GYN provider-  She thinks she may be in Menopause.  She still has her Mirena- it was placed maybe 2019 Pap- 2019 Mammogram- she is coming up, she will schedule this  Flu vaccine- done, she thinks september Cologuard up-to-date Suggest Shingrix  She is trying to exercise as much as she can Works for [employer] doing strategy There are no problems to display for this patient.   No past medical history on file.  No past surgical history on file.  Social History   Tobacco Use  . Smoking status: Never Smoker  . Smokeless tobacco: Never Used  Substance Use Topics  . Alcohol use: Yes  . Drug use: No    No family history on file.  No Known  Allergies  Medication list has been reviewed and updated.  Current Outpatient Medications on File Prior to Visit  Medication Sig Dispense Refill  . FLUoxetine (PROZAC) 20 MG tablet Take 1 tablet (20 mg total) by mouth daily. May increase to 40 mg after 2 weeks 180 tablet 3  . ketoconazole (NIZORAL) 2 % shampoo Apply 1 application topically 2 (two) times a week. 120 mL 0  . levonorgestrel (MIRENA) 20 MCG/24HR IUD by Intrauterine route.     No current facility-administered medications on file prior to visit.    Review of Systems:  As per HPI- otherwise negative.   Physical Examination: There were no vitals filed for this visit. There were no vitals filed for this visit. There is no height or weight on file to calculate BMI. Ideal Body Weight:    Pt observed over video monitor.  She looks well, no cough, wheezing, distress is noted She is not checking blood pressure other vitals at this time BP Readings from Last 3 Encounters:  12/27/17 122/78  12/31/16 126/73     Assessment and Plan: Physical exam  Screening for diabetes mellitus - Plan: Comprehensive metabolic panel, Hemoglobin A1c  Screening for hyperlipidemia - Plan: Lipid panel  Screening for deficiency anemia - Plan: CBC  Situational anxiety - Plan: FLUoxetine (PROZAC) 40 MG  capsule  Screening for HIV (human immunodeficiency virus) - Plan: HIV Antibody (routine testing w rflx)  Screening for thyroid disorder - Plan: TSH  Virtual physical exam done today due to inclement weather and COVID-19 pandemic.  Patient is working from home right now herself and her family are doing okay.  She is trying to exercise as much she can.  Doing well on fluoxetine 40 mg, I refilled this for her today Discussed health maintenance, she will schedule her mammogram.  She is otherwise up-to-date We will send patient written information about health maintenance and promotion, schedule labs ASAP   This visit occurred during the  SARS-CoV-2 public health emergency.  Safety protocols were in place, including screening questions prior to the visit, additional usage of staff PPE, and extensive cleaning of exam room while observing appropriate contact time as indicated for disinfecting solutions.    Signed Abbe Amsterdam, MD

## 2019-02-21 NOTE — Patient Instructions (Signed)
Good to see you again today, I will be in touch her labs as soon as possible   Health Maintenance, Female Adopting a healthy lifestyle and getting preventive care are important in promoting health and wellness. Ask your health care provider about:  The right schedule for you to have regular tests and exams.  Things you can do on your own to prevent diseases and keep yourself healthy. What should I know about diet, weight, and exercise? Eat a healthy diet   Eat a diet that includes plenty of vegetables, fruits, low-fat dairy products, and lean protein.  Do not eat a lot of foods that are high in solid fats, added sugars, or sodium. Maintain a healthy weight Body mass index (BMI) is used to identify weight problems. It estimates body fat based on height and weight. Your health care provider can help determine your BMI and help you achieve or maintain a healthy weight. Get regular exercise Get regular exercise. This is one of the most important things you can do for your health. Most adults should:  Exercise for at least 150 minutes each week. The exercise should increase your heart rate and make you sweat (moderate-intensity exercise).  Do strengthening exercises at least twice a week. This is in addition to the moderate-intensity exercise.  Spend less time sitting. Even light physical activity can be beneficial. Watch cholesterol and blood lipids Have your blood tested for lipids and cholesterol at 54 years of age, then have this test every 5 years. Have your cholesterol levels checked more often if:  Your lipid or cholesterol levels are high.  You are older than 54 years of age.  You are at high risk for heart disease. What should I know about cancer screening? Depending on your health history and family history, you may need to have cancer screening at various ages. This may include screening for:  Breast cancer.  Cervical cancer.  Colorectal cancer.  Skin cancer.  Lung  cancer. What should I know about heart disease, diabetes, and high blood pressure? Blood pressure and heart disease  High blood pressure causes heart disease and increases the risk of stroke. This is more likely to develop in people who have high blood pressure readings, are of African descent, or are overweight.  Have your blood pressure checked: ? Every 3-5 years if you are 70-19 years of age. ? Every year if you are 36 years old or older. Diabetes Have regular diabetes screenings. This checks your fasting blood sugar level. Have the screening done:  Once every three years after age 55 if you are at a normal weight and have a low risk for diabetes.  More often and at a younger age if you are overweight or have a high risk for diabetes. What should I know about preventing infection? Hepatitis B If you have a higher risk for hepatitis B, you should be screened for this virus. Talk with your health care provider to find out if you are at risk for hepatitis B infection. Hepatitis C Testing is recommended for:  Everyone born from [birth year] through 1965.  Anyone with known risk factors for hepatitis C. Sexually transmitted infections (STIs)  Get screened for STIs, including gonorrhea and chlamydia, if: ? You are sexually active and are younger than 54 years of age. ? You are older than 54 years of age and your health care provider tells you that you are at risk for this type of infection. ? Your sexual activity has changed since you  were last screened, and you are at increased risk for chlamydia or gonorrhea. Ask your health care provider if you are at risk.  Ask your health care provider about whether you are at high risk for HIV. Your health care provider may recommend a prescription medicine to help prevent HIV infection. If you choose to take medicine to prevent HIV, you should first get tested for HIV. You should then be tested every 3 months for as long as you are taking the  medicine. Pregnancy  If you are about to stop having your period (premenopausal) and you may become pregnant, seek counseling before you get pregnant.  Take 400 to 800 micrograms (mcg) of folic acid every day if you become pregnant.  Ask for birth control (contraception) if you want to prevent pregnancy. Osteoporosis and menopause Osteoporosis is a disease in which the bones lose minerals and strength with aging. This can result in bone fractures. If you are 72 years old or older, or if you are at risk for osteoporosis and fractures, ask your health care provider if you should:  Be screened for bone loss.  Take a calcium or vitamin D supplement to lower your risk of fractures.  Be given hormone replacement therapy (HRT) to treat symptoms of menopause. Follow these instructions at home: Lifestyle  Do not use any products that contain nicotine or tobacco, such as cigarettes, e-cigarettes, and chewing tobacco. If you need help quitting, ask your health care provider.  Do not use street drugs.  Do not share needles.  Ask your health care provider for help if you need support or information about quitting drugs. Alcohol use  Do not drink alcohol if: ? Your health care provider tells you not to drink. ? You are pregnant, may be pregnant, or are planning to become pregnant.  If you drink alcohol: ? Limit how much you use to 0-1 drink a day. ? Limit intake if you are breastfeeding.  Be aware of how much alcohol is in your drink. In the [place] one drink equals one 12 oz bottle of beer (355 mL), one 5 oz glass of wine (148 mL), or one 1 oz glass of hard liquor (44 mL). General instructions  Schedule regular health, dental, and eye exams.  Stay current with your vaccines.  Tell your health care provider if: ? You often feel depressed. ? You have ever been abused or do not feel safe at home. Summary  Adopting a healthy lifestyle and getting preventive care are important in  promoting health and wellness.  Follow your health care provider's instructions about healthy diet, exercising, and getting tested or screened for diseases.  Follow your health care provider's instructions on monitoring your cholesterol and blood pressure. This information is not intended to replace advice given to you by your health care provider. Make sure you discuss any questions you have with your health care provider. Document Revised: 12/15/2017 Document Reviewed: 12/15/2017 Elsevier Patient Education  2020 [employer].

## 2019-02-23 ENCOUNTER — Encounter: Payer: Self-pay | Admitting: Family Medicine

## 2019-02-23 ENCOUNTER — Ambulatory Visit (INDEPENDENT_AMBULATORY_CARE_PROVIDER_SITE_OTHER): Payer: BC Managed Care – PPO | Admitting: Family Medicine

## 2019-02-23 DIAGNOSIS — Z114 Encounter for screening for human immunodeficiency virus [HIV]: Secondary | ICD-10-CM

## 2019-02-23 DIAGNOSIS — Z0000 Encounter for general adult medical examination without abnormal findings: Secondary | ICD-10-CM | POA: Diagnosis not present

## 2019-02-23 DIAGNOSIS — Z131 Encounter for screening for diabetes mellitus: Secondary | ICD-10-CM

## 2019-02-23 DIAGNOSIS — Z13220 Encounter for screening for lipoid disorders: Secondary | ICD-10-CM | POA: Diagnosis not present

## 2019-02-23 DIAGNOSIS — Z130 Encounter for screening for diseases of the blood and blood-forming organs and certain disorders involving the immune mechanism: Secondary | ICD-10-CM | POA: Diagnosis not present

## 2019-02-23 DIAGNOSIS — Z1329 Encounter for screening for other suspected endocrine disorder: Secondary | ICD-10-CM

## 2019-02-23 DIAGNOSIS — F418 Other specified anxiety disorders: Secondary | ICD-10-CM

## 2019-02-23 MED ORDER — FLUOXETINE HCL 40 MG PO CAPS: 40.0000 mg | ORAL_CAPSULE | Freq: Every day | ORAL | 3 refills | Status: DC

## 2019-03-06 ENCOUNTER — Other Ambulatory Visit (INDEPENDENT_AMBULATORY_CARE_PROVIDER_SITE_OTHER): Payer: BC Managed Care – PPO

## 2019-03-06 ENCOUNTER — Other Ambulatory Visit: Payer: Self-pay

## 2019-03-06 DIAGNOSIS — Z1329 Encounter for screening for other suspected endocrine disorder: Secondary | ICD-10-CM | POA: Diagnosis not present

## 2019-03-06 DIAGNOSIS — Z13220 Encounter for screening for lipoid disorders: Secondary | ICD-10-CM

## 2019-03-06 DIAGNOSIS — Z114 Encounter for screening for human immunodeficiency virus [HIV]: Secondary | ICD-10-CM | POA: Diagnosis not present

## 2019-03-06 DIAGNOSIS — Z130 Encounter for screening for diseases of the blood and blood-forming organs and certain disorders involving the immune mechanism: Secondary | ICD-10-CM

## 2019-03-06 DIAGNOSIS — Z131 Encounter for screening for diabetes mellitus: Secondary | ICD-10-CM

## 2019-03-06 LAB — TSH: TSH: 3.5 u[IU]/mL (ref 0.35–4.50)

## 2019-03-06 LAB — LIPID PANEL
Cholesterol: 220 mg/dL — ABNORMAL HIGH (ref 0–200)
HDL: 51.9 mg/dL (ref >39.00)
LDL Cholesterol: 137 mg/dL — ABNORMAL HIGH (ref 0–99)
NonHDL: 167.72
Total CHOL/HDL Ratio: 4
Triglycerides: 154 mg/dL — ABNORMAL HIGH (ref 0.0–149.0)
VLDL: 30.8 mg/dL (ref 0.0–40.0)

## 2019-03-06 LAB — HEMOGLOBIN A1C: Hgb A1c MFr Bld: 5.5 % (ref 4.6–6.5)

## 2019-03-06 LAB — COMPREHENSIVE METABOLIC PANEL
ALT: 25 U/L (ref 0–35)
AST: 19 U/L (ref 0–37)
Albumin: 4.1 g/dL (ref 3.5–5.2)
Alkaline Phosphatase: 65 U/L (ref 39–117)
BUN: 12 mg/dL (ref 6–23)
CO2: 28 mEq/L (ref 19–32)
Calcium: 9.4 mg/dL (ref 8.4–10.5)
Chloride: 103 mEq/L (ref 96–112)
Creatinine, Ser: 0.95 mg/dL (ref 0.40–1.20)
GFR: 61.45 mL/min (ref >60.00)
Glucose, Bld: 100 mg/dL — ABNORMAL HIGH (ref 70–99)
Potassium: 4.7 mEq/L (ref 3.5–5.1)
Sodium: 138 mEq/L (ref 135–145)
Total Bilirubin: 0.6 mg/dL (ref 0.2–1.2)
Total Protein: 6.7 g/dL (ref 6.0–8.3)

## 2019-03-07 ENCOUNTER — Telehealth: Payer: Self-pay | Admitting: *Deleted

## 2019-03-07 DIAGNOSIS — Z130 Encounter for screening for diseases of the blood and blood-forming organs and certain disorders involving the immune mechanism: Secondary | ICD-10-CM

## 2019-03-07 LAB — HIV ANTIBODY (ROUTINE TESTING W REFLEX): HIV 1&2 Ab, 4th Generation: NONREACTIVE

## 2019-03-07 NOTE — Telephone Encounter (Signed)
Pt was to return to the lab for redraw today at 8am, no need to be on lab scheduled. Pt did not show. Attempted to reach pt and voicemail is full. Sent [employer]. [facility] is holding blood / orders to see if pt returns tomorrow.

## 2019-03-08 ENCOUNTER — Other Ambulatory Visit: Payer: Self-pay

## 2019-03-08 ENCOUNTER — Other Ambulatory Visit (INDEPENDENT_AMBULATORY_CARE_PROVIDER_SITE_OTHER): Payer: BC Managed Care – PPO

## 2019-03-08 ENCOUNTER — Encounter: Payer: Self-pay | Admitting: Family Medicine

## 2019-03-08 DIAGNOSIS — Z130 Encounter for screening for diseases of the blood and blood-forming organs and certain disorders involving the immune mechanism: Secondary | ICD-10-CM

## 2019-03-08 LAB — CBC WITH DIFFERENTIAL/PLATELET
Basophils Relative: 1.3 % (ref 0.0–3.0)
Eosinophils Relative: 2 % (ref 0.0–5.0)
HCT: 40.5 % (ref 36.0–46.0)
Hemoglobin: 13.4 g/dL (ref 12.0–15.0)
Lymphocytes Relative: 30.7 % (ref 12.0–46.0)
MCHC: 33.1 g/dL (ref 30.0–36.0)
MCV: 91 fl (ref 78.0–100.0)
Monocytes Relative: 7.5 % (ref 3.0–12.0)
Neutrophils Relative %: 58.5 % (ref 43.0–77.0)
Platelets: 267 10*3/uL (ref 150.0–400.0)
RBC: 4.45 Mil/uL (ref 3.87–5.11)
RDW: 13.5 % (ref 11.5–15.5)
WBC: 4.7 10*3/uL (ref 4.0–10.5)

## 2019-03-08 NOTE — Telephone Encounter (Signed)
Received call from Courtney Spence in the lab stating we need to reorder CBC w/diff since pt has not returned for redraw at this time. Order entered as future. Reminder that due to platelet clumps in previous CBC, we will need to draw a lt blue tube and send with CBC.

## 2019-03-08 NOTE — Progress Notes (Signed)
No charge, redraw. 

## 2019-04-03 DIAGNOSIS — M1712 Unilateral primary osteoarthritis, left knee: Secondary | ICD-10-CM | POA: Diagnosis not present

## 2019-04-03 DIAGNOSIS — M1711 Unilateral primary osteoarthritis, right knee: Secondary | ICD-10-CM | POA: Diagnosis not present

## 2020-01-17 DIAGNOSIS — Z1231 Encounter for screening mammogram for malignant neoplasm of breast: Secondary | ICD-10-CM | POA: Diagnosis not present

## 2020-01-17 DIAGNOSIS — Z1239 Encounter for other screening for malignant neoplasm of breast: Secondary | ICD-10-CM | POA: Diagnosis not present

## 2020-02-15 ENCOUNTER — Encounter: Payer: Self-pay | Admitting: Family Medicine

## 2020-02-15 ENCOUNTER — Telehealth: Payer: Self-pay

## 2020-02-15 NOTE — Telephone Encounter (Signed)
Should we do labs before so we can talk about results?     I am specifically interested in understanding I am having difficulty losing weight. I also would like to understand if I need some hormone replacement since I'm still experiencing a low sex drive and vaginal pain during intercourse.    Thanks!   Courtney Spence   Received this message from patient via mychart. She has an appointment scheduled on 2/21

## 2020-02-23 NOTE — Progress Notes (Addendum)
[facility] [address], [address] [place], [place] [address] [phone]  Date:  02/26/2020   Name:  Courtney Spence   DOB:  [birth year]   MRN:  [id number]  PCP:  Pearline Cables, MD    Chief Complaint: Annual Exam (pap)   History of Present Illness:  Courtney Spence is a 55 y.o. very pleasant female patient who presents with the following:  Here today for a CPE Last seen by myself about one year ago   Generally in good health  Her husband had a lung transplant for interstitial lung diease a few year ago now She has 2 children ages 9 and 82  Hep C screening mammo- 01/17/20  covid series done  Pap- about 3 years ago.  No abnormal in the past.  Will update today  Flu done  cologuard due for update- will order for her today  Most recent labs one year ago- she just had some fruit to eat so far today shingirx - she would like to get first dose today  She is working on her weight- she is down 10 lbs from the new year She is overall feeling well, she is trying to eat well and move more  She has a sedentary job but is trying to get as much exercise as she can  She and her husband are planning a trip to the [facility] coming up Overall she feels well Doing well with fluoxetine, wishes to continue  Wt Readings from Last 3 Encounters:  02/26/20 207 lb (93.9 kg)  12/27/17 181 lb (82.1 kg)  12/31/16 201 lb 6.4 oz (91.4 kg)    There are no problems to display for this patient.   History reviewed. No pertinent past medical history.  History reviewed. No pertinent surgical history.  Social History   Tobacco Use  . Smoking status: Never Smoker  . Smokeless tobacco: Never Used  Vaping Use  . Vaping Use: Never used  Substance Use Topics  . Alcohol use: Yes  . Drug use: No    History reviewed. No pertinent family history.  No Known Allergies  Medication list has been reviewed and updated.  Current  Outpatient Medications on File Prior to Visit  Medication Sig Dispense Refill  . FLUoxetine (PROZAC) 40 MG capsule Take 1 capsule (40 mg total) by mouth daily. 90 capsule 3  . levonorgestrel (MIRENA) 20 MCG/24HR IUD by Intrauterine route.     No current facility-administered medications on file prior to visit.    Review of Systems:  As per HPI- otherwise negative.   Physical Examination: Vitals:   02/26/20 1301  BP: 126/88  Pulse: 76  Resp: 16  SpO2: 98%   Vitals:   02/26/20 1301  Weight: 207 lb (93.9 kg)  Height: 5\' 5"  (1.651 m)   Body mass index is 34.45 kg/m. Ideal Body Weight: Weight in (lb) to have BMI = 25: 149.9  GEN: no acute distress.  Obese, otherwise looks well HEENT: Atraumatic, Normocephalic.   Bilateral TM wnl, oropharynx normal.  PEERL,EOMI.   Ears and Nose: No external deformity. CV: RRR, No M/G/R. No JVD. No thrill. No extra heart sounds. PULM: CTA B, no wheezes, crackles, rhonchi. No retractions. No resp. distress. No accessory muscle use. ABD: S, NT, ND, +BS. No rebound. No HSM. EXTR: No c/c/e PSYCH: Normally interactive. Conversant.  Pelvic: normal, no vaginal lesions or discharge. Uterus normal, no CMT, no adnexal tendereness or masses  IUD strings in place     Assessment and Plan: Physical exam  Fatigue, unspecified type - Plan: TSH, VITAMIN D 25 Hydroxy (Vit-D Deficiency, Fractures)  Screening for deficiency anemia - Plan: CBC  Screening for diabetes mellitus - Plan: Comprehensive metabolic panel, Hemoglobin A1c  Screening for hyperlipidemia - Plan: Lipid panel  Screening for thyroid disorder - Plan: TSH  Encounter for hepatitis C screening test for low risk patient - Plan: Hepatitis C antibody  Screening for cervical cancer - Plan: Cytology - PAP  Situational anxiety - Plan: FLUoxetine (PROZAC) 40 MG capsule  Immunization due - Plan: Varicella-zoster vaccine IM (Shingrix)  Patient here today for physical exam She is working on  weight loss, encouraged her efforts Labs and Pap pending as above- Will plan further follow- up pending labs. First dose of Shingrix given today Mammogram is up-to-date Order Cologuard This visit occurred during the SARS-CoV-2 public health emergency.  Safety protocols were in place, including screening questions prior to the visit, additional usage of staff PPE, and extensive cleaning of exam room while observing appropriate contact time as indicated for disinfecting solutions.    Signed Abbe Amsterdam, MD  Received her labs as below, 2/23.  Message to patient  Results for orders placed or performed in visit on 02/26/20  Comprehensive metabolic panel  Result Value Ref Range   Sodium 138 135 - 145 mEq/L   Potassium 4.1 3.5 - 5.1 mEq/L   Chloride 103 96 - 112 mEq/L   CO2 27 19 - 32 mEq/L   Glucose, Bld 93 70 - 99 mg/dL   BUN 14 6 - 23 mg/dL   Creatinine, Ser 3.76 0.40 - 1.20 mg/dL   Total Bilirubin 0.6 0.2 - 1.2 mg/dL   Alkaline Phosphatase 77 39 - 117 U/L   AST 12 0 - 37 U/L   ALT 11 0 - 35 U/L   Total Protein 7.0 6.0 - 8.3 g/dL   Albumin 4.3 3.5 - 5.2 g/dL   GFR 28.31 >51.76 mL/min   Calcium 9.5 8.4 - 10.5 mg/dL  Hemoglobin H6W  Result Value Ref Range   Hgb A1c MFr Bld 5.6 4.6 - 6.5 %  Hepatitis C antibody  Result Value Ref Range   Hepatitis C Ab NON-REACTIVE NON-REACTI   SIGNAL TO CUT-OFF 0.00 <1.00  Lipid panel  Result Value Ref Range   Cholesterol 215 (H) 0 - 200 mg/dL   Triglycerides 737.1 (H) 0.0 - 149.0 mg/dL   HDL 06.26 >94.85 mg/dL   VLDL 46.2 0.0 - 70.3 mg/dL   LDL Cholesterol 500 (H) 0 - 99 mg/dL   Total CHOL/HDL Ratio 4    NonHDL 162.40   TSH  Result Value Ref Range   TSH 1.75 0.35 - 4.50 uIU/mL  VITAMIN D 25 Hydroxy (Vit-D Deficiency, Fractures)  Result Value Ref Range   VITD 16.11 (L) 30.00 - 100.00 ng/mL  Cytology - PAP  Result Value Ref Range   High risk HPV Negative    Adequacy      Satisfactory for evaluation; transformation zone component  PRESENT.   Diagnosis      - Negative for intraepithelial lesion or malignancy (NILM)   Comment Normal Reference Range HPV - Negative

## 2020-02-23 NOTE — Patient Instructions (Addendum)
Good to see you again today-  I will be in touch with your labs asap and will send in your insurance form 1st dose of shingrix given today- you can get your 2nd dose in 2-6 months as a nurse visit only  Have a great time on your trip!!    Health Maintenance, Female Adopting a healthy lifestyle and getting preventive care are important in promoting health and wellness. Ask your health care provider about:  The right schedule for you to have regular tests and exams.  Things you can do on your own to prevent diseases and keep yourself healthy. What should I know about diet, weight, and exercise? Eat a healthy diet  Eat a diet that includes plenty of vegetables, fruits, low-fat dairy products, and lean protein.  Do not eat a lot of foods that are high in solid fats, added sugars, or sodium.   Maintain a healthy weight Body mass index (BMI) is used to identify weight problems. It estimates body fat based on height and weight. Your health care provider can help determine your BMI and help you achieve or maintain a healthy weight. Get regular exercise Get regular exercise. This is one of the most important things you can do for your health. Most adults should:  Exercise for at least 150 minutes each week. The exercise should increase your heart rate and make you sweat (moderate-intensity exercise).  Do strengthening exercises at least twice a week. This is in addition to the moderate-intensity exercise.  Spend less time sitting. Even light physical activity can be beneficial. Watch cholesterol and blood lipids Have your blood tested for lipids and cholesterol at 55 years of age, then have this test every 5 years. Have your cholesterol levels checked more often if:  Your lipid or cholesterol levels are high.  You are older than 55 years of age.  You are at high risk for heart disease. What should I know about cancer screening? Depending on your health history and family history, you may  need to have cancer screening at various ages. This may include screening for:  Breast cancer.  Cervical cancer.  Colorectal cancer.  Skin cancer.  Lung cancer. What should I know about heart disease, diabetes, and high blood pressure? Blood pressure and heart disease  High blood pressure causes heart disease and increases the risk of stroke. This is more likely to develop in people who have high blood pressure readings, are of African descent, or are overweight.  Have your blood pressure checked: ? Every 3-5 years if you are 44-13 years of age. ? Every year if you are 63 years old or older. Diabetes Have regular diabetes screenings. This checks your fasting blood sugar level. Have the screening done:  Once every three years after age 72 if you are at a normal weight and have a low risk for diabetes.  More often and at a younger age if you are overweight or have a high risk for diabetes. What should I know about preventing infection? Hepatitis B If you have a higher risk for hepatitis B, you should be screened for this virus. Talk with your health care provider to find out if you are at risk for hepatitis B infection. Hepatitis C Testing is recommended for:  Everyone born from [birth year] through 1965.  Anyone with known risk factors for hepatitis C. Sexually transmitted infections (STIs)  Get screened for STIs, including gonorrhea and chlamydia, if: ? You are sexually active and are younger than 55 years  of age. ? You are older than 55 years of age and your health care provider tells you that you are at risk for this type of infection. ? Your sexual activity has changed since you were last screened, and you are at increased risk for chlamydia or gonorrhea. Ask your health care provider if you are at risk.  Ask your health care provider about whether you are at high risk for HIV. Your health care provider may recommend a prescription medicine to help prevent HIV infection. If you  choose to take medicine to prevent HIV, you should first get tested for HIV. You should then be tested every 3 months for as long as you are taking the medicine. Pregnancy  If you are about to stop having your period (premenopausal) and you may become pregnant, seek counseling before you get pregnant.  Take 400 to 800 micrograms (mcg) of folic acid every day if you become pregnant.  Ask for birth control (contraception) if you want to prevent pregnancy. Osteoporosis and menopause Osteoporosis is a disease in which the bones lose minerals and strength with aging. This can result in bone fractures. If you are 35 years old or older, or if you are at risk for osteoporosis and fractures, ask your health care provider if you should:  Be screened for bone loss.  Take a calcium or vitamin D supplement to lower your risk of fractures.  Be given hormone replacement therapy (HRT) to treat symptoms of menopause. Follow these instructions at home: Lifestyle  Do not use any products that contain nicotine or tobacco, such as cigarettes, e-cigarettes, and chewing tobacco. If you need help quitting, ask your health care provider.  Do not use street drugs.  Do not share needles.  Ask your health care provider for help if you need support or information about quitting drugs. Alcohol use  Do not drink alcohol if: ? Your health care provider tells you not to drink. ? You are pregnant, may be pregnant, or are planning to become pregnant.  If you drink alcohol: ? Limit how much you use to 0-1 drink a day. ? Limit intake if you are breastfeeding.  Be aware of how much alcohol is in your drink. In the [place] one drink equals one 12 oz bottle of beer (355 mL), one 5 oz glass of wine (148 mL), or one 1 oz glass of hard liquor (44 mL). General instructions  Schedule regular health, dental, and eye exams.  Stay current with your vaccines.  Tell your health care provider if: ? You often feel  depressed. ? You have ever been abused or do not feel safe at home. Summary  Adopting a healthy lifestyle and getting preventive care are important in promoting health and wellness.  Follow your health care provider's instructions about healthy diet, exercising, and getting tested or screened for diseases.  Follow your health care provider's instructions on monitoring your cholesterol and blood pressure. This information is not intended to replace advice given to you by your health care provider. Make sure you discuss any questions you have with your health care provider. Document Revised: 12/15/2017 Document Reviewed: 12/15/2017 Elsevier Patient Education  2021 [employer].

## 2020-02-26 ENCOUNTER — Other Ambulatory Visit (HOSPITAL_COMMUNITY)
Admission: RE | Admit: 2020-02-26 | Discharge: 2020-02-26 | Disposition: A | Discharge status: Home / Self Care | Payer: BC Managed Care – PPO | Source: Ambulatory Visit | Attending: Family Medicine | Admitting: Family Medicine

## 2020-02-26 ENCOUNTER — Other Ambulatory Visit: Payer: Self-pay

## 2020-02-26 ENCOUNTER — Encounter: Payer: Self-pay | Admitting: Family Medicine

## 2020-02-26 ENCOUNTER — Ambulatory Visit (INDEPENDENT_AMBULATORY_CARE_PROVIDER_SITE_OTHER): Payer: BC Managed Care – PPO | Admitting: Family Medicine

## 2020-02-26 VITALS — BP 126/88 | HR 76 | Resp 16 | Ht 65.0 in | Wt 207.0 lb

## 2020-02-26 DIAGNOSIS — Z130 Encounter for screening for diseases of the blood and blood-forming organs and certain disorders involving the immune mechanism: Secondary | ICD-10-CM

## 2020-02-26 DIAGNOSIS — E559 Vitamin D deficiency, unspecified: Secondary | ICD-10-CM

## 2020-02-26 DIAGNOSIS — Z1211 Encounter for screening for malignant neoplasm of colon: Secondary | ICD-10-CM

## 2020-02-26 DIAGNOSIS — R5383 Other fatigue: Secondary | ICD-10-CM

## 2020-02-26 DIAGNOSIS — Z1159 Encounter for screening for other viral diseases: Secondary | ICD-10-CM | POA: Diagnosis not present

## 2020-02-26 DIAGNOSIS — Z1329 Encounter for screening for other suspected endocrine disorder: Secondary | ICD-10-CM | POA: Diagnosis not present

## 2020-02-26 DIAGNOSIS — Z124 Encounter for screening for malignant neoplasm of cervix: Secondary | ICD-10-CM

## 2020-02-26 DIAGNOSIS — Z13220 Encounter for screening for lipoid disorders: Secondary | ICD-10-CM

## 2020-02-26 DIAGNOSIS — F418 Other specified anxiety disorders: Secondary | ICD-10-CM

## 2020-02-26 DIAGNOSIS — Z0000 Encounter for general adult medical examination without abnormal findings: Secondary | ICD-10-CM

## 2020-02-26 DIAGNOSIS — Z131 Encounter for screening for diabetes mellitus: Secondary | ICD-10-CM

## 2020-02-26 DIAGNOSIS — Z23 Encounter for immunization: Secondary | ICD-10-CM | POA: Diagnosis not present

## 2020-02-26 MED ORDER — FLUOXETINE HCL 40 MG PO CAPS: 40.0000 mg | ORAL_CAPSULE | Freq: Every day | ORAL | 3 refills | Status: DC

## 2020-02-26 NOTE — Addendum Note (Signed)
Addended by: Steve Rattler A on: 02/26/2020 03:01 PM   Modules accepted: Orders

## 2020-02-27 ENCOUNTER — Telehealth: Payer: Self-pay | Admitting: *Deleted

## 2020-02-27 DIAGNOSIS — Z130 Encounter for screening for diseases of the blood and blood-forming organs and certain disorders involving the immune mechanism: Secondary | ICD-10-CM

## 2020-02-27 LAB — LIPID PANEL
Cholesterol: 215 mg/dL — ABNORMAL HIGH (ref 0–200)
HDL: 52.6 mg/dL (ref >39.00)
LDL Cholesterol: 125 mg/dL — ABNORMAL HIGH (ref 0–99)
NonHDL: 162.4
Total CHOL/HDL Ratio: 4
Triglycerides: 187 mg/dL — ABNORMAL HIGH (ref 0.0–149.0)
VLDL: 37.4 mg/dL (ref 0.0–40.0)

## 2020-02-27 LAB — COMPREHENSIVE METABOLIC PANEL
ALT: 11 U/L (ref 0–35)
AST: 12 U/L (ref 0–37)
Albumin: 4.3 g/dL (ref 3.5–5.2)
Alkaline Phosphatase: 77 U/L (ref 39–117)
BUN: 14 mg/dL (ref 6–23)
CO2: 27 mEq/L (ref 19–32)
Calcium: 9.5 mg/dL (ref 8.4–10.5)
Chloride: 103 mEq/L (ref 96–112)
Creatinine, Ser: 0.86 mg/dL (ref 0.40–1.20)
GFR: 76.62 mL/min (ref >60.00)
Glucose, Bld: 93 mg/dL (ref 70–99)
Potassium: 4.1 mEq/L (ref 3.5–5.1)
Sodium: 138 mEq/L (ref 135–145)
Total Bilirubin: 0.6 mg/dL (ref 0.2–1.2)
Total Protein: 7 g/dL (ref 6.0–8.3)

## 2020-02-27 LAB — TSH: TSH: 1.75 u[IU]/mL (ref 0.35–4.50)

## 2020-02-27 LAB — VITAMIN D 25 HYDROXY (VIT D DEFICIENCY, FRACTURES): VITD: 16.11 ng/mL — ABNORMAL LOW (ref 30.00–100.00)

## 2020-02-27 LAB — HEMOGLOBIN A1C: Hgb A1c MFr Bld: 5.6 % (ref 4.6–6.5)

## 2020-02-27 LAB — HEPATITIS C ANTIBODY
Hepatitis C Ab: NONREACTIVE
SIGNAL TO CUT-OFF: 0 (ref <1.00)

## 2020-02-27 NOTE — Telephone Encounter (Signed)
Received call from the lab that pt's CBC from yesterday has platelet clumps in it. They will need for [place] to recollect the specimen and draw a light blue top tube first and send both samples to the lab. Left message for pt to return my call.

## 2020-02-28 ENCOUNTER — Encounter: Payer: Self-pay | Admitting: Family Medicine

## 2020-02-28 LAB — CYTOLOGY - PAP
Comment: NEGATIVE
Diagnosis: NEGATIVE
High risk HPV: NEGATIVE

## 2020-02-28 MED ORDER — VITAMIN D3 1.25 MG (50000 UT) PO CAPS: ORAL_CAPSULE | ORAL | 0 refills | Status: AC

## 2020-02-28 NOTE — Telephone Encounter (Signed)
Pt returned call. States she has had this happen 3 times and wonders if she should be concerned that she has platelet clumps?  I advised pt that this sometimes happens and there is not always an underlying medical concern. I advised pt to let us know before we do lab work that her samples sometimes have platelet clumps and we will try to draw the extra light blue tube for initial processing so we don't have to call her back for repeat. Pt voices understanding.  Please advise if there is anything else that should be done or worked up in regards to the platelet clumping?  Pt has scheduled return lab appt for Friday.

## 2020-02-28 NOTE — Addendum Note (Signed)
Addended by: Abbe Amsterdam C on: 02/28/2020 01:02 PM   Modules accepted: Orders

## 2020-03-01 ENCOUNTER — Encounter: Payer: Self-pay | Admitting: Family Medicine

## 2020-03-01 ENCOUNTER — Other Ambulatory Visit: Payer: Self-pay

## 2020-03-01 ENCOUNTER — Other Ambulatory Visit (INDEPENDENT_AMBULATORY_CARE_PROVIDER_SITE_OTHER): Payer: BC Managed Care – PPO

## 2020-03-01 DIAGNOSIS — Z130 Encounter for screening for diseases of the blood and blood-forming organs and certain disorders involving the immune mechanism: Secondary | ICD-10-CM

## 2020-03-01 LAB — CBC
HCT: 40.4 % (ref 36.0–46.0)
Hemoglobin: 13.5 g/dL (ref 12.0–15.0)
MCHC: 33.5 g/dL (ref 30.0–36.0)
MCV: 89.8 fl (ref 78.0–100.0)
Platelets: 200 10*3/uL (ref 150.0–400.0)
RBC: 4.5 Mil/uL (ref 3.87–5.11)
RDW: 13.2 % (ref 11.5–15.5)
WBC: 6.8 10*3/uL (ref 4.0–10.5)

## 2020-03-01 NOTE — Progress Notes (Signed)
No charge, redraw. 

## 2020-03-05 NOTE — Telephone Encounter (Signed)
See Provider response in 2/23 patient message.

## 2020-03-08 DIAGNOSIS — Z1211 Encounter for screening for malignant neoplasm of colon: Secondary | ICD-10-CM | POA: Diagnosis not present

## 2020-03-08 LAB — COLOGUARD: Cologuard: NEGATIVE

## 2020-03-16 LAB — COLOGUARD: COLOGUARD: NEGATIVE

## 2020-03-16 LAB — EXTERNAL GENERIC LAB PROCEDURE: COLOGUARD: NEGATIVE

## 2020-03-25 ENCOUNTER — Encounter: Payer: Self-pay | Admitting: Family Medicine

## 2020-03-29 ENCOUNTER — Encounter: Payer: Self-pay | Admitting: Family Medicine

## 2020-05-25 ENCOUNTER — Other Ambulatory Visit: Payer: Self-pay | Admitting: Family Medicine

## 2020-05-25 DIAGNOSIS — E559 Vitamin D deficiency, unspecified: Secondary | ICD-10-CM

## 2020-05-27 ENCOUNTER — Encounter: Payer: Self-pay | Admitting: Family Medicine

## 2020-08-07 DIAGNOSIS — M1711 Unilateral primary osteoarthritis, right knee: Secondary | ICD-10-CM | POA: Diagnosis not present

## 2020-08-07 DIAGNOSIS — M1712 Unilateral primary osteoarthritis, left knee: Secondary | ICD-10-CM | POA: Diagnosis not present

## 2020-09-06 ENCOUNTER — Emergency Department (HOSPITAL_BASED_OUTPATIENT_CLINIC_OR_DEPARTMENT_OTHER)
Admission: EM | Admit: 2020-09-06 | Discharge: 2020-09-06 | Disposition: A | Discharge status: Home / Self Care | Payer: BC Managed Care – PPO | Attending: Emergency Medicine | Admitting: Emergency Medicine

## 2020-09-06 ENCOUNTER — Encounter (HOSPITAL_BASED_OUTPATIENT_CLINIC_OR_DEPARTMENT_OTHER): Payer: Self-pay | Admitting: *Deleted

## 2020-09-06 ENCOUNTER — Emergency Department (HOSPITAL_BASED_OUTPATIENT_CLINIC_OR_DEPARTMENT_OTHER): Payer: BC Managed Care – PPO

## 2020-09-06 ENCOUNTER — Other Ambulatory Visit: Payer: Self-pay

## 2020-09-06 DIAGNOSIS — J90 Pleural effusion, not elsewhere classified: Secondary | ICD-10-CM | POA: Insufficient documentation

## 2020-09-06 DIAGNOSIS — R0789 Other chest pain: Secondary | ICD-10-CM | POA: Diagnosis not present

## 2020-09-06 DIAGNOSIS — M25512 Pain in left shoulder: Secondary | ICD-10-CM | POA: Diagnosis not present

## 2020-09-06 DIAGNOSIS — R079 Chest pain, unspecified: Secondary | ICD-10-CM | POA: Diagnosis not present

## 2020-09-06 HISTORY — DX: Anxiety disorder, unspecified: F41.9

## 2020-09-06 LAB — CBC WITH DIFFERENTIAL/PLATELET
Abs Immature Granulocytes: 0.01 10*3/uL (ref 0.00–0.07)
Basophils Absolute: 0.1 10*3/uL (ref 0.0–0.1)
Basophils Relative: 1 %
Eosinophils Absolute: 0.1 10*3/uL (ref 0.0–0.5)
Eosinophils Relative: 1 %
HCT: 39.4 % (ref 36.0–46.0)
Hemoglobin: 12.9 g/dL (ref 12.0–15.0)
Immature Granulocytes: 0 %
Lymphocytes Relative: 14 %
Lymphs Abs: 1.2 10*3/uL (ref 0.7–4.0)
MCH: 29.9 pg (ref 26.0–34.0)
MCHC: 32.7 g/dL (ref 30.0–36.0)
MCV: 91.4 fL (ref 80.0–100.0)
Monocytes Absolute: 0.8 10*3/uL (ref 0.1–1.0)
Monocytes Relative: 10 %
Neutro Abs: 6.1 10*3/uL (ref 1.7–7.7)
Neutrophils Relative %: 74 %
Platelets: 174 10*3/uL (ref 150–400)
RBC: 4.31 MIL/uL (ref 3.87–5.11)
RDW: 12.8 % (ref 11.5–15.5)
WBC: 8.1 10*3/uL (ref 4.0–10.5)
nRBC: 0 % (ref 0.0–0.2)

## 2020-09-06 LAB — BASIC METABOLIC PANEL
Anion gap: 9 (ref 5–15)
BUN: 12 mg/dL (ref 6–20)
CO2: 28 mmol/L (ref 22–32)
Calcium: 9.3 mg/dL (ref 8.9–10.3)
Chloride: 101 mmol/L (ref 98–111)
Creatinine, Ser: 0.76 mg/dL (ref 0.44–1.00)
GFR, Estimated: >60 mL/min (ref >60)
Glucose, Bld: 100 mg/dL — ABNORMAL HIGH (ref 70–99)
Potassium: 4.1 mmol/L (ref 3.5–5.1)
Sodium: 138 mmol/L (ref 135–145)

## 2020-09-06 LAB — D-DIMER, QUANTITATIVE: D-Dimer, Quant: 0.56 ug/mL-FEU — ABNORMAL HIGH (ref 0.00–0.50)

## 2020-09-06 LAB — TROPONIN I (HIGH SENSITIVITY): Troponin I (High Sensitivity): 2 ng/L (ref <18)

## 2020-09-06 MED ORDER — CYCLOBENZAPRINE HCL 10 MG PO TABS: 10.0000 mg | ORAL_TABLET | Freq: Two times a day (BID) | ORAL | 0 refills | Status: DC | PRN

## 2020-09-06 MED ORDER — IOHEXOL 350 MG/ML SOLN
100.0000 mL | Freq: Once | INTRAVENOUS | Status: AC | PRN
  Administered 2020-09-06: 100 mL via INTRAVENOUS

## 2020-09-06 MED ORDER — DOXYCYCLINE HYCLATE 100 MG PO CAPS: 100.0000 mg | ORAL_CAPSULE | Freq: Two times a day (BID) | ORAL | 0 refills | Status: DC

## 2020-09-06 NOTE — ED Provider Notes (Signed)
[facility] EMERGENCY DEPARTMENT Provider Note   CSN: [id number] Arrival date & time: 09/06/20  1436     History No chief complaint on file.   Courtney Spence is a 55 y.o. female with no significant medical history who presents to the Emergency Department today for left shoulder pain that began yesterday.  She states that she was sitting at her desk at work when she began having left sided shoulder pain that radiated down into the left lateral chest.  It is worse with deep respirations.  Is been constant since onset.  She took some Advil which improved her pain but she woke up with it again this morning in the left side of the neck and shoulder.  She denies any long car/flight travels, hemoptysis, syncope, dizziness, chest pain, shortness of breath, abdominal pain, nausea, vomiting, diarrhea, leg pain, leg swelling, fever, or chills.  No history of cancer.  HPI     Past Medical History:  Diagnosis Date   Anxiety     There are no problems to display for this patient.   History reviewed. No pertinent surgical history.   OB History   No obstetric history on file.     No family history on file.  Social History   Tobacco Use   Smoking status: Never   Smokeless tobacco: Never  Vaping Use   Vaping Use: Never used  Substance Use Topics   Alcohol use: Yes   Drug use: No    Home Medications Prior to Admission medications   Medication Sig Start Date End Date Taking? Authorizing Provider  Cholecalciferol (VITAMIN D3) 1.25 MG (50000 UT) CAPS Take 1 weekly for 12 weeks 02/28/20  Yes Copland, Gwenlyn Found, MD  cyclobenzaprine (FLEXERIL) 10 MG tablet Take 1 tablet (10 mg total) by mouth 2 (two) times daily as needed for muscle spasms. 09/06/20  Yes Meredeth Ide, Mikeal Winstanley M, PA-C  doxycycline (VIBRAMYCIN) 100 MG capsule Take 1 capsule (100 mg total) by mouth 2 (two) times daily. 09/06/20  Yes Meredeth Ide, Nazaria Riesen M, PA-C  FLUoxetine (PROZAC) 40 MG capsule Take 1 capsule (40 mg total) by mouth  daily. 02/26/20  Yes Copland, Gwenlyn Found, MD  levonorgestrel (MIRENA) 20 MCG/24HR IUD by Intrauterine route.    [provider]    Allergies    Patient has no known allergies.  Review of Systems   Review of Systems  All other systems reviewed and are negative.  Physical Exam Updated Vital Signs BP 138/77   Pulse 69   Temp 98.4 F (36.9 C) (Oral)   Resp 19   Ht 5\' 5"  (1.651 m)   Wt 93 kg   SpO2 96%   BMI 34.11 kg/m   Physical Exam Constitutional:      General: She is not in acute distress.    Appearance: Normal appearance.  HENT:     Head: Normocephalic and atraumatic.  Eyes:     General:        Right eye: No discharge.        Left eye: No discharge.  Cardiovascular:     Comments: Regular rate and rhythm.  S1/S2 are distinct without any evidence of murmur, rubs, or gallops.  Radial pulses are 2+ bilaterally.  Dorsalis pedis pulses are 2+ bilaterally.  No evidence of pedal edema. Pulmonary:     Comments: Clear to auscultation bilaterally.  Normal effort.  No respiratory distress.  No evidence of wheezes, rales, or rhonchi heard throughout. Abdominal:     General: Abdomen is  flat. Bowel sounds are normal. There is no distension.     Tenderness: There is no abdominal tenderness. There is no guarding or rebound.  Musculoskeletal:        General: Normal range of motion.     Cervical back: Neck supple.  Skin:    General: Skin is warm and dry.     Findings: No rash.  Neurological:     General: No focal deficit present.     Mental Status: She is alert.  Psychiatric:        Mood and Affect: Mood normal.        Behavior: Behavior normal.    ED Results / Procedures / Treatments   Labs (all labs ordered are listed, but only abnormal results are displayed) Labs Reviewed  BASIC METABOLIC PANEL - Abnormal; Notable for the following components:      Result Value   Glucose, Bld 100 (*)    All other components within normal limits  D-DIMER, QUANTITATIVE - Abnormal;  Notable for the following components:   D-Dimer, Quant 0.56 (*)    All other components within normal limits  CBC WITH DIFFERENTIAL/PLATELET  TROPONIN I (HIGH SENSITIVITY)    EKG None  Radiology CT Angio Chest PE W and/or Wo Contrast  Result Date: 09/06/2020 CLINICAL DATA:  Chest pain or SOB, pleurisy or effusion suspected chest pain Elevated D-dimer.  Left-sided chest pain. EXAM: CT ANGIOGRAPHY CHEST WITH CONTRAST TECHNIQUE: Multidetector CT imaging of the chest was performed using the standard protocol during bolus administration of intravenous contrast. Multiplanar CT image reconstructions and MIPs were obtained to evaluate the vascular anatomy. CONTRAST:  OMNIPAQUE IOHEXOL 350 MG/ML SOLN COMPARISON:  Radiograph earlier today. FINDINGS: Cardiovascular: There are no filling defects within the pulmonary arteries to suggest pulmonary embolus. Thoracic aorta is normal in caliber. No aortic dissection. The left vertebral artery arises directly from the thoracic aorta, variant arch anatomy. Heart is normal in size. Minimal pericardial fluid anteriorly is likely physiologic. Mediastinum/Nodes: No enlarged mediastinal or hilar lymph nodes. No visualized thyroid nodule. No esophageal wall thickening. Lungs/Pleura: Breathing motion artifact limits detailed assessment. Suggestion of heterogeneous pulmonary parenchyma in the mid and lower lung zones. Streaky and ground-glass opacity in the lingula, series 5, image 54. Small left pleural effusion. No pulmonary mass. Trachea and central bronchi are patent. Upper Abdomen: No acute or unexpected findings. Musculoskeletal: Thoracic spondylosis with scattered degenerative change and Schmorl's nodes. There are no acute or suspicious osseous abnormalities. Review of the MIP images confirms the above findings. IMPRESSION: 1. No pulmonary embolus. 2. Small left pleural effusion. 3. Streaky and ground-glass opacity in the lingula, may represent atelectasis or  infection/pneumonia. 4. Heterogeneous pulmonary parenchyma in the mid and lower lung zones. This may be accentuated by motion, or can be seen with small vessel or small airways disease. Electronically Signed   By: Narda Rutherford M.D.   On: 09/06/2020 17:09   DG Chest Portable 1 View  Result Date: 09/06/2020 CLINICAL DATA:  Left chest wall pain EXAM: PORTABLE CHEST 1 VIEW COMPARISON:  None. FINDINGS: The heart size and mediastinal contours are within normal limits. Both lungs are clear. The visualized skeletal structures are unremarkable. IMPRESSION: No active disease. Electronically Signed   By: Jasmine Pang M.D.   On: 09/06/2020 16:26    Procedures Procedures   Medications Ordered in ED Medications  iohexol (OMNIPAQUE) 350 MG/ML injection 100 mL (100 mLs Intravenous Contrast Given 09/06/20 1648)    ED Course  I have reviewed  the triage vital signs and the nursing notes.  Pertinent labs & imaging results that were available during my care of the patient were reviewed by me and considered in my medical decision making (see chart for details).  Clinical Course as of 09/06/20 1759  Fri Sep 06, 2020  1516 EKG reveals normal sinus rhythm.  No evidence of ST elevation or depression.  No other signs of ischemic changes. [CF]    Clinical Course User Index [CF] Jolyn Lent   MDM Rules/Calculators/A&P                         Courtney Spence is a 55 year old otherwise healthy female who presents to the emergency department today for further evaluation of left shoulder and left lateral chest pain that began yesterday.  Physical exam was reassuring.  No evidence of respiratory distress.  She is not tachypneic.  She is hemodynamically stable.  Afebrile and not tachycardic.  Her lung sounds were clear to auscultation bilaterally.  Given her history of pain with respirations and considering lung pathology including pulmonary embolism, pneumonia, and pneumothorax.  I have a low suspicion for  reactive airway disease.  T showed no leukocytosis.  BMP was within normal limits.  Troponin was negative.  Have a low suspicion for ACS.  She has not had any chest pain in the department nor prior.  D-dimer was slightly elevated.  CT angio revealed no evidence of pulmonary embolism.  There was pleural effusion and questionable pneumonia in the left lung.  Will empirically treat for community-acquired pneumonia.  On reevaluation, she is improved.  At this point, it is unclear the etiology of her left shoulder pain.  Could be musculoskeletal in nature given the point tenderness over the left trapezius muscle.  As mentioned above, I will likely treat for community acquired pneumonia empirically.  Doxycycline for possible pneumonia.  Flexeril for muscle spasms.  Instructed her not to take this medication and operate heavy machinery or mix with alcohol. Strict return precautions given. Will have her follow up with her PCP.   Final Clinical Impression(s) / ED Diagnoses Final diagnoses:  Acute pain of left shoulder  Pleural effusion    Rx / DC Orders ED Discharge Orders          Ordered    doxycycline (VIBRAMYCIN) 100 MG capsule  2 times daily        09/06/20 1757    cyclobenzaprine (FLEXERIL) 10 MG tablet  2 times daily PRN        09/06/20 1757             Teressa Lower, PA-C 09/06/20 1814    Franne Forts, DO 09/07/20 [phone]

## 2020-09-06 NOTE — Discharge Instructions (Addendum)
He was seen and evaluated in the emergency department today for left shoulder pain and left chest wall pain.  As we discussed, there was no evidence of damage to your heart or pulmonary embolism.  I will give you doxycycline for possible developing pneumonia and Flexeril for muscle spasm.  Please do not operate heavy machinery when taking Flexeril or mix with alcohol.  Please follow-up with your primary care provider  Return to the emergency department for worsening pain, increased shortness of breath, new fever, chills, chest pain, or any other concerns you may have.

## 2020-09-06 NOTE — ED Triage Notes (Signed)
Yesterday she noticed pain in her left abdomen. Afterward she felt tingling in her left arm with radiation up to the left side of her neck. Pain is no better today.

## 2020-09-13 ENCOUNTER — Encounter: Payer: Self-pay | Admitting: Family Medicine

## 2020-09-14 NOTE — Progress Notes (Deleted)
[facility] [address], [address] [place], [place] [address] [phone]  Date:  09/18/2020   Name:  Courtney Spence   DOB:  1965-08-06   MRN:  [id number]  PCP:  Pearline Cables, MD    Chief Complaint: No chief complaint on file.   History of Present Illness:  Courtney Spence is a 55 y.o. very pleasant female patient who presents with the following:  Pt seen today with concern of illness  Last seen by myself in February for CPE She is generally in good health, married with 2 adult children   She was in the ER on 9/2 with concern of shoulder pain, she was treated with doxy for possible CAP Chest film negative   There are no problems to display for this patient.   Past Medical History:  Diagnosis Date   Anxiety     No past surgical history on file.  Social History   Tobacco Use   Smoking status: Never   Smokeless tobacco: Never  Vaping Use   Vaping Use: Never used  Substance Use Topics   Alcohol use: Yes   Drug use: No    No family history on file.  No Known Allergies  Medication list has been reviewed and updated.  Current Outpatient Medications on File Prior to Visit  Medication Sig Dispense Refill   Cholecalciferol (VITAMIN D3) 1.25 MG (50000 UT) CAPS Take 1 weekly for 12 weeks 12 capsule 0   cyclobenzaprine (FLEXERIL) 10 MG tablet Take 1 tablet (10 mg total) by mouth 2 (two) times daily as needed for muscle spasms. 10 tablet 0   doxycycline (VIBRAMYCIN) 100 MG capsule Take 1 capsule (100 mg total) by mouth 2 (two) times daily. 10 capsule 0   FLUoxetine (PROZAC) 40 MG capsule Take 1 capsule (40 mg total) by mouth daily. 90 capsule 3   levonorgestrel (MIRENA) 20 MCG/24HR IUD by Intrauterine route.     No current facility-administered medications on file prior to visit.    Review of Systems:  As per HPI- otherwise negative.   Physical Examination: There were no vitals filed for this visit. There  were no vitals filed for this visit. There is no height or weight on file to calculate BMI. Ideal Body Weight:    GEN: no acute distress. HEENT: Atraumatic, Normocephalic.  Ears and Nose: No external deformity. CV: RRR, No M/G/R. No JVD. No thrill. No extra heart sounds. PULM: CTA B, no wheezes, crackles, rhonchi. No retractions. No resp. distress. No accessory muscle use. ABD: S, NT, ND, +BS. No rebound. No HSM. EXTR: No c/c/e PSYCH: Normally interactive. Conversant.    Assessment and Plan: ***  This visit occurred during the SARS-CoV-2 public health emergency.  Safety protocols were in place, including screening questions prior to the visit, additional usage of staff PPE, and extensive cleaning of exam room while observing appropriate contact time as indicated for disinfecting solutions.   Signed Abbe Amsterdam, MD

## 2020-09-18 ENCOUNTER — Ambulatory Visit: Payer: BC Managed Care – PPO | Admitting: Family Medicine

## 2020-09-20 ENCOUNTER — Telehealth: Payer: Self-pay | Admitting: Internal Medicine

## 2020-09-20 NOTE — Telephone Encounter (Signed)
Courtney Spence  Is wife of my pulmonary fibrosis patient [name].  They called me for me to review her CT scan of the chest after she reported to the ER with some pleuritic pain and PE was ruled out.  I did visualize a CT chest and explained to them that some nonspecific inflammation in the lower lobe.  Advised her to follow-up with primary care physician with a repeat CT scan at an appropriate time interval of stay 3-6 months.  Sooner if particularly symptoms are persisting.  If symptoms continue to persist then I will be happy to see her     SIGNATURE    Dr. Kalman Shan, M.D., F.C.C.P,  Pulmonary and Critical Care Medicine Staff Physician, [facility] Director - Interstitial Lung Disease  Program  Pulmonary Fibrosis [facility] [place], [place], [address]  NPI Number:  NPI #[id number]  Pager: [phone], If no answer  -> Check AMION or Try [phone] Telephone (clinical office): [phone] Telephone (research): [phone]  5:29 PM 09/20/2020

## 2020-09-24 NOTE — Patient Instructions (Addendum)
It was good to see you again today!   I ordered a repeat CT of your chest to be done in January of 2023. Let me know if any questions or concerns in the meantime Flu shot and 2nd shingrix today Ok to do covid booster at your convenience

## 2020-09-24 NOTE — Progress Notes (Signed)
[facility] at [facility] [address] [place], [place] [address] [phone]  Date:  09/25/2020   Name:  Courtney Spence   DOB:  1965/10/28   MRN:  [id number]  PCP:  Pearline Cables, MD    Chief Complaint: Pain (Pt was seen at the ER on 09/06/20 for shoulder and chest pain, was prescribed Flexeril and Doxy to treat for Pneumonia presumptively. Pt is feeling better, no longer taking either Rx./Concerns/ questions: due for 2nd dose of shingles/Flu shot today: yes/)   History of Present Illness:  Courtney Spence is a 55 y.o. very pleasant female patient who presents with the following:  Patient seen today for concern of recent illness.  Generally healthy, history of anxiety Her husband has history of interstitial lung disease, he is status post lung transplant She has 2 young adult children- they are 37 and 27  Most recent visit with myself was in February for physical She was seen in the ER on September 2 with left shoulder pain.  She was evaluated, low suspicion for ACS She was given doxycycline for possible pneumonia CT angiogram 9/2:  IMPRESSION: 1. No pulmonary embolus. 2. Small left pleural effusion. 3. Streaky and ground-glass opacity in the lingula, may represent atelectasis or infection/pneumonia. 4. Heterogeneous pulmonary parenchyma in the mid and lower lung zones. This may be accentuated by motion, or can be seen with small vessel or small airways disease..  She contacted me about 10 days ago with concern of congestion in her chest, pain had resolved.  She wanted to make sure everything was ok  She also contacted pulmonology, Dr. Marchelle Gearing who takes care of her husband.  He looked over her CT, noted nonspecific inflammation in the lower lobe and recommended a repeat CT scan in 3 to 6 months  COVID-19 new booster-she plans to do in the next week or 2 Second dose of shingles vaccine- give 2nd dose today  Flu shot- give today    She notes that she is feeling normal again- no pain with deep breath The pain cleared up about a week ago.  She finished off her abx as well  [place] in [place] and did a lot of walking- she did fine with this  She does plan to have knee replacement in upcoming months due to chronic knee pain and arthritis  There are no problems to display for this patient.   Past Medical History:  Diagnosis Date   Anxiety     No past surgical history on file.  Social History   Tobacco Use   Smoking status: Never   Smokeless tobacco: Never  Vaping Use   Vaping Use: Never used  Substance Use Topics   Alcohol use: Yes   Drug use: No    No family history on file.  No Known Allergies  Medication list has been reviewed and updated.  Current Outpatient Medications on File Prior to Visit  Medication Sig Dispense Refill   Cholecalciferol (VITAMIN D3) 1.25 MG (50000 UT) CAPS Take 1 weekly for 12 weeks 12 capsule 0   FLUoxetine (PROZAC) 40 MG capsule Take 1 capsule (40 mg total) by mouth daily. 90 capsule 3   levonorgestrel (MIRENA) 20 MCG/24HR IUD by Intrauterine route.     No current facility-administered medications on file prior to visit.    Review of Systems:  As per HPI- otherwise negative.   Physical Examination: Vitals:   09/25/20 1042  BP: 124/80  Pulse: 73  Resp: 18  Temp: 98.3 F (36.8 C)  SpO2: 98%   Vitals:   09/25/20 1042  Weight: 211 lb (95.7 kg)  Height: 5\' 5"  (1.651 m)   Body mass index is 35.11 kg/m. Ideal Body Weight: Weight in (lb) to have BMI = 25: 149.9  GEN: no acute distress.  Obese, looks well HEENT: Atraumatic, Normocephalic.  Ears and Nose: No external deformity. CV: RRR, No M/G/R. No JVD. No thrill. No extra heart sounds. PULM: CTA B, no wheezes, crackles, rhonchi. No retractions. No resp. distress. No accessory muscle use. EXTR: No c/c/e PSYCH: Normally interactive. Conversant.    Assessment and Plan: Community acquired  pneumonia, unspecified laterality - Plan: CT Chest Wo Contrast  Immunization due - Plan: Flu Vaccine QUAD 6+ mos PF IM (Fluarix Quad PF), Varicella-zoster vaccine IM (Shingrix)  Need for influenza vaccination - Plan: Flu Vaccine QUAD 6+ mos PF IM (Fluarix Quad PF)  Need for shingles vaccine - Plan: Varicella-zoster vaccine IM (Shingrix)  Following up today from ER visit with pneumonia.  She had a CT angiogram at that time which showed nonspecific pulmonary findings as above.  Pulmonology recommended doing a CT scan in 3 to 6 months, I have ordered this for her today  Otherwise, her symptoms are resolved.  She will continue to monitor  Update immunizations  This visit occurred during the SARS-CoV-2 public health emergency.  Safety protocols were in place, including screening questions prior to the visit, additional usage of staff PPE, and extensive cleaning of exam room while observing appropriate contact time as indicated for disinfecting solutions.   Signed , MD

## 2020-09-25 ENCOUNTER — Ambulatory Visit (INDEPENDENT_AMBULATORY_CARE_PROVIDER_SITE_OTHER): Payer: BC Managed Care – PPO | Admitting: Family Medicine

## 2020-09-25 ENCOUNTER — Encounter: Payer: Self-pay | Admitting: Family Medicine

## 2020-09-25 ENCOUNTER — Other Ambulatory Visit: Payer: Self-pay

## 2020-09-25 VITALS — BP 124/80 | HR 73 | Temp 98.3°F | Resp 18 | Ht 65.0 in | Wt 211.0 lb

## 2020-09-25 DIAGNOSIS — J189 Pneumonia, unspecified organism: Secondary | ICD-10-CM

## 2020-09-25 DIAGNOSIS — Z23 Encounter for immunization: Secondary | ICD-10-CM

## 2020-10-21 DIAGNOSIS — M1990 Unspecified osteoarthritis, unspecified site: Secondary | ICD-10-CM | POA: Diagnosis not present

## 2020-11-10 ENCOUNTER — Encounter: Payer: Self-pay | Admitting: Family Medicine

## 2020-11-10 DIAGNOSIS — Z01818 Encounter for other preprocedural examination: Secondary | ICD-10-CM

## 2020-11-21 ENCOUNTER — Other Ambulatory Visit (INDEPENDENT_AMBULATORY_CARE_PROVIDER_SITE_OTHER): Payer: BC Managed Care – PPO

## 2020-11-21 ENCOUNTER — Other Ambulatory Visit: Payer: Self-pay

## 2020-11-21 ENCOUNTER — Encounter: Payer: Self-pay | Admitting: Family Medicine

## 2020-11-21 ENCOUNTER — Ambulatory Visit (HOSPITAL_BASED_OUTPATIENT_CLINIC_OR_DEPARTMENT_OTHER)
Admission: RE | Admit: 2020-11-21 | Discharge: 2020-11-21 | Disposition: A | Discharge status: Home / Self Care | Payer: BC Managed Care – PPO | Source: Ambulatory Visit | Attending: Family Medicine | Admitting: Family Medicine

## 2020-11-21 ENCOUNTER — Telehealth: Payer: Self-pay

## 2020-11-21 DIAGNOSIS — J189 Pneumonia, unspecified organism: Secondary | ICD-10-CM | POA: Diagnosis not present

## 2020-11-21 DIAGNOSIS — Z01818 Encounter for other preprocedural examination: Secondary | ICD-10-CM

## 2020-11-21 LAB — BASIC METABOLIC PANEL
BUN: 15 mg/dL (ref 6–23)
CO2: 31 mEq/L (ref 19–32)
Calcium: 9.6 mg/dL (ref 8.4–10.5)
Chloride: 101 mEq/L (ref 96–112)
Creatinine, Ser: 0.96 mg/dL (ref 0.40–1.20)
GFR: 66.8 mL/min (ref >60.00)
Glucose, Bld: 78 mg/dL (ref 70–99)
Potassium: 4.5 mEq/L (ref 3.5–5.1)
Sodium: 138 mEq/L (ref 135–145)

## 2020-11-21 LAB — PROTIME-INR
INR: 0.9 ratio (ref 0.8–1.0)
Prothrombin Time: 10.5 s (ref 9.6–13.1)

## 2020-11-21 NOTE — Addendum Note (Signed)
Addended by: Rosita Kea on: 11/21/2020 01:06 PM   Modules accepted: Orders

## 2020-11-21 NOTE — Telephone Encounter (Signed)
Called patient and informed her per [facility] she needs to return for recollect of cbc w/diff due to platelets, patient verbalized understanding and is scheduled or labs 11/22/20 at 8 am. No charge and will collect light blue tube with lavender.

## 2020-11-22 ENCOUNTER — Other Ambulatory Visit (INDEPENDENT_AMBULATORY_CARE_PROVIDER_SITE_OTHER): Payer: BC Managed Care – PPO

## 2020-11-22 DIAGNOSIS — Z01818 Encounter for other preprocedural examination: Secondary | ICD-10-CM | POA: Diagnosis not present

## 2020-11-22 LAB — CBC WITH DIFFERENTIAL/PLATELET
Basophils Absolute: 0.1 10*3/uL (ref 0.0–0.1)
Basophils Relative: 1.1 % (ref 0.0–3.0)
Eosinophils Absolute: 0.1 10*3/uL (ref 0.0–0.7)
Eosinophils Relative: 2.2 % (ref 0.0–5.0)
HCT: 39.7 % (ref 36.0–46.0)
Hemoglobin: 13.3 g/dL (ref 12.0–15.0)
Lymphocytes Relative: 30.5 % (ref 12.0–46.0)
Lymphs Abs: 2.1 10*3/uL (ref 0.7–4.0)
MCHC: 33.4 g/dL (ref 30.0–36.0)
MCV: 90 fl (ref 78.0–100.0)
Monocytes Absolute: 0.5 10*3/uL (ref 0.1–1.0)
Monocytes Relative: 7.4 % (ref 3.0–12.0)
Neutro Abs: 4 10*3/uL (ref 1.4–7.7)
Neutrophils Relative %: 58.8 % (ref 43.0–77.0)
Platelets: 200 10*3/uL (ref 150.0–400.0)
RBC: 4.42 Mil/uL (ref 3.87–5.11)
RDW: 13.4 % (ref 11.5–15.5)
WBC: 5 10*3/uL (ref 4.0–10.5)

## 2020-11-22 NOTE — Progress Notes (Signed)
Pt here for recollection of CBC due to platelet clumps. Draw light blue and lavender, no charge today.

## 2020-12-02 DIAGNOSIS — M1711 Unilateral primary osteoarthritis, right knee: Secondary | ICD-10-CM | POA: Diagnosis not present

## 2020-12-09 DIAGNOSIS — M25561 Pain in right knee: Secondary | ICD-10-CM | POA: Diagnosis not present

## 2020-12-19 DIAGNOSIS — M1712 Unilateral primary osteoarthritis, left knee: Secondary | ICD-10-CM | POA: Diagnosis not present

## 2021-02-12 ENCOUNTER — Encounter: Payer: Self-pay | Admitting: Family Medicine

## 2021-02-13 NOTE — Telephone Encounter (Signed)
Form given to [place].

## 2021-02-24 DIAGNOSIS — Z1231 Encounter for screening mammogram for malignant neoplasm of breast: Secondary | ICD-10-CM | POA: Diagnosis not present

## 2021-02-24 LAB — HM MAMMOGRAPHY

## 2021-02-24 NOTE — Patient Instructions (Addendum)
Great to see you again today!   I will be in touch with your labs asap We will refer you to nutrition for some teaching  You have lost weight!  Great job- keep working   [employer] from Last 3 Encounters:  02/27/21 197 lb 3.2 oz (89.4 kg)  09/25/20 211 lb (95.7 kg)  09/06/20 205 lb (93 kg)

## 2021-02-24 NOTE — Progress Notes (Addendum)
[facility] [address] [place], [place] [address] [phone]  Date:  02/27/2021   Name:  Courtney Spence   DOB:  Jul 01, 1965   MRN:  [id number]  PCP:  Pearline Cables, MD    Chief Complaint: Annual Exam (Concerns/ questions: none)   History of Present Illness:  Courtney Spence is a 56 y.o. very pleasant female patient who presents with the following:  Pt seen today for a CPE- she is generally in good health  Last seen by myself 9/22 Her husband has history of interstitial lung disease, he is status post lung transplant.  He is coming up on 5 years post- transplant  She has 2 young adult children- they are 37 and 45  Covid booster- done Mammo- done this week Pap UTD Cologuard UTD  Most recent labs done 11/22- BMP and CBC for pre-op  Taking fluoxetine 40 Has a mirena IUD  She just finished up PT from her knee surgery- they think she will need her other knee done eventually but not right now  She is fasting today for labs   There are no problems to display for this patient.   Past Medical History:  Diagnosis Date   Anxiety     No past surgical history on file.  Social History   Tobacco Use   Smoking status: Never   Smokeless tobacco: Never  Vaping Use   Vaping Use: Never used  Substance Use Topics   Alcohol use: Yes   Drug use: No    No family history on file.  No Known Allergies  Medication list has been reviewed and updated.  Current Outpatient Medications on File Prior to Visit  Medication Sig Dispense Refill   Cholecalciferol (VITAMIN D3) 1.25 MG (50000 UT) CAPS Take 1 weekly for 12 weeks 12 capsule 0   FLUoxetine (PROZAC) 40 MG capsule Take 1 capsule (40 mg total) by mouth daily. 90 capsule 3   levonorgestrel (MIRENA) 20 MCG/24HR IUD by Intrauterine route.     No current facility-administered medications on file prior to visit.    Review of Systems:  As per HPI- otherwise  negative.   Physical Examination: Vitals:   02/27/21 1302  BP: 122/82  Pulse: 73  Resp: 18  Temp: (!) 97.3 F (36.3 C)  SpO2: 97%   Vitals:   02/27/21 1302  Weight: 197 lb 3.2 oz (89.4 kg)  Height: 5\' 5"  (1.651 m)   Body mass index is 32.82 kg/m. Ideal Body Weight: Weight in (lb) to have BMI = 25: 149.9  GEN: no acute distress.  Obese, looks well  HEENT: Atraumatic, Normocephalic. Bilateral TM wnl, oropharynx normal.  PEERL,EOMI.   Ears and Nose: No external deformity. CV: RRR, No M/G/R. No JVD. No thrill. No extra heart sounds. PULM: CTA B, no wheezes, crackles, rhonchi. No retractions. No resp. distress. No accessory muscle use. ABD: S, NT, ND, +BS. No rebound. No HSM. EXTR: No c/c/e PSYCH: Normally interactive. Conversant.   Wt Readings from Last 3 Encounters:  02/27/21 197 lb 3.2 oz (89.4 kg)  09/25/20 211 lb (95.7 kg)  09/06/20 205 lb (93 kg)    Assessment and Plan: Physical exam  Screening for diabetes mellitus - Plan: Comprehensive metabolic panel, Hemoglobin A1c  Screening for hyperlipidemia - Plan: Lipid panel  Screening for thyroid disorder - Plan: TSH  Fatigue, unspecified type - Plan: TSH, VITAMIN D 25 Hydroxy (Vit-D Deficiency, Fractures)  Situational anxiety -  Plan: FLUoxetine (PROZAC) 40 MG capsule  Overweight - Plan: Amb ref to Medical Nutrition Therapy-MNT Encouraged healthy diet and exercise routine.  We discussed saxenda - she prefers to continue to try with diet and exercise.   She would like a referral to nutrition Prozac is working well- refilled Will plan further follow- up pending labs.  I will fax in insurance form once the labs come in Signed Abbe Amsterdam, MD  Addendum 2/24, received her labs as below.  Message to patient and completed her insurance form  Results for orders placed or performed in visit on 02/27/21  Comprehensive metabolic panel  Result Value Ref Range   Sodium 139 135 - 145 mEq/L   Potassium 4.3 3.5 - 5.1  mEq/L   Chloride 101 96 - 112 mEq/L   CO2 31 19 - 32 mEq/L   Glucose, Bld 90 70 - 99 mg/dL   BUN 11 6 - 23 mg/dL   Creatinine, Ser 1.61 0.40 - 1.20 mg/dL   Total Bilirubin 0.5 0.2 - 1.2 mg/dL   Alkaline Phosphatase 85 39 - 117 U/L   AST 17 0 - 37 U/L   ALT 19 0 - 35 U/L   Total Protein 7.0 6.0 - 8.3 g/dL   Albumin 4.4 3.5 - 5.2 g/dL   GFR 09.60 >45.40 mL/min   Calcium 9.6 8.4 - 10.5 mg/dL  Hemoglobin J8J  Result Value Ref Range   Hgb A1c MFr Bld 5.5 4.6 - 6.5 %  Lipid panel  Result Value Ref Range   Cholesterol 230 (H) 0 - 200 mg/dL   Triglycerides 191.4 (H) 0.0 - 149.0 mg/dL   HDL 78.29 >56.21 mg/dL   VLDL 30.8 0.0 - 65.7 mg/dL   LDL Cholesterol 846 (H) 0 - 99 mg/dL   Total CHOL/HDL Ratio 4    NonHDL 177.85   TSH  Result Value Ref Range   TSH 1.83 0.35 - 5.50 uIU/mL  VITAMIN D 25 Hydroxy (Vit-D Deficiency, Fractures)  Result Value Ref Range   VITD 32.24 30.00 - 100.00 ng/mL

## 2021-02-27 ENCOUNTER — Ambulatory Visit (INDEPENDENT_AMBULATORY_CARE_PROVIDER_SITE_OTHER): Payer: BC Managed Care – PPO | Admitting: Family Medicine

## 2021-02-27 VITALS — BP 122/82 | HR 73 | Temp 97.3°F | Resp 18 | Ht 65.0 in | Wt 197.2 lb

## 2021-02-27 DIAGNOSIS — Z0000 Encounter for general adult medical examination without abnormal findings: Secondary | ICD-10-CM

## 2021-02-27 DIAGNOSIS — R5383 Other fatigue: Secondary | ICD-10-CM

## 2021-02-27 DIAGNOSIS — F418 Other specified anxiety disorders: Secondary | ICD-10-CM

## 2021-02-27 DIAGNOSIS — Z131 Encounter for screening for diabetes mellitus: Secondary | ICD-10-CM | POA: Diagnosis not present

## 2021-02-27 DIAGNOSIS — Z13220 Encounter for screening for lipoid disorders: Secondary | ICD-10-CM

## 2021-02-27 DIAGNOSIS — E663 Overweight: Secondary | ICD-10-CM

## 2021-02-27 DIAGNOSIS — Z1329 Encounter for screening for other suspected endocrine disorder: Secondary | ICD-10-CM | POA: Diagnosis not present

## 2021-02-27 MED ORDER — FLUOXETINE HCL 40 MG PO CAPS: 40.0000 mg | ORAL_CAPSULE | Freq: Every day | ORAL | 3 refills | Status: DC

## 2021-02-28 ENCOUNTER — Encounter: Payer: Self-pay | Admitting: Family Medicine

## 2021-02-28 LAB — LIPID PANEL
Cholesterol: 230 mg/dL — ABNORMAL HIGH (ref 0–200)
HDL: 52.4 mg/dL (ref >39.00)
LDL Cholesterol: 141 mg/dL — ABNORMAL HIGH (ref 0–99)
NonHDL: 177.85
Total CHOL/HDL Ratio: 4
Triglycerides: 186 mg/dL — ABNORMAL HIGH (ref 0.0–149.0)
VLDL: 37.2 mg/dL (ref 0.0–40.0)

## 2021-02-28 LAB — COMPREHENSIVE METABOLIC PANEL
ALT: 19 U/L (ref 0–35)
AST: 17 U/L (ref 0–37)
Albumin: 4.4 g/dL (ref 3.5–5.2)
Alkaline Phosphatase: 85 U/L (ref 39–117)
BUN: 11 mg/dL (ref 6–23)
CO2: 31 mEq/L (ref 19–32)
Calcium: 9.6 mg/dL (ref 8.4–10.5)
Chloride: 101 mEq/L (ref 96–112)
Creatinine, Ser: 0.78 mg/dL (ref 0.40–1.20)
GFR: 85.54 mL/min (ref >60.00)
Glucose, Bld: 90 mg/dL (ref 70–99)
Potassium: 4.3 mEq/L (ref 3.5–5.1)
Sodium: 139 mEq/L (ref 135–145)
Total Bilirubin: 0.5 mg/dL (ref 0.2–1.2)
Total Protein: 7 g/dL (ref 6.0–8.3)

## 2021-02-28 LAB — HEMOGLOBIN A1C: Hgb A1c MFr Bld: 5.5 % (ref 4.6–6.5)

## 2021-02-28 LAB — TSH: TSH: 1.83 u[IU]/mL (ref 0.35–5.50)

## 2021-02-28 LAB — VITAMIN D 25 HYDROXY (VIT D DEFICIENCY, FRACTURES): VITD: 32.24 ng/mL (ref 30.00–100.00)

## 2021-03-05 ENCOUNTER — Encounter: Payer: Self-pay | Admitting: Family Medicine

## 2021-03-10 ENCOUNTER — Encounter: Payer: BC Managed Care – PPO | Admitting: Family Medicine

## 2021-04-16 ENCOUNTER — Encounter: Payer: Self-pay | Admitting: Registered"

## 2021-04-16 ENCOUNTER — Encounter: Payer: BC Managed Care – PPO | Attending: Family Medicine | Admitting: Registered"

## 2021-04-16 DIAGNOSIS — E663 Overweight: Secondary | ICD-10-CM | POA: Diagnosis not present

## 2021-04-16 DIAGNOSIS — Z713 Dietary counseling and surveillance: Secondary | ICD-10-CM | POA: Insufficient documentation

## 2021-04-16 NOTE — Patient Instructions (Signed)
-   Aim to to balance breakfast with at least carbohydrates + protein.  ? ?- Aim to increase water intake to at least 4 bottles a day.  ? ?- Aim to choose lower cholesterol options. See handout as reference.  ? ?- Keep up the great work with physical activity and adequate sleep! ?

## 2021-04-16 NOTE — Progress Notes (Signed)
Medical Nutrition Therapy  ?Appointment Start time:  8:03  Appointment End time:  8:56 ? ?Primary concerns today: different strategies related to food ?Referral diagnosis: overweight ?Preferred learning style: no preference indicated ?Learning readiness: change in progress ? ? ?NUTRITION ASSESSMENT  ? ?Anthropometrics  ? ?States going through menopause and has gained weight. Reports she wants to figure out what's going on. States she wants to check out how she is eating to possibly lose some weight. States she has not been having many side effects of menopause; reports occasional hot flash and denies sleep challenges.  ? ?States she has been tracking caloric intake using Lose It app. Reports app determines calorie amount to be around 1900 kcals depending on activity level for that day. States she has been aiming for less carbohydrates and fat and increased protein. Reports app suggests fasting time 8 pm - 8 am but she questions using it.  States they utilize meal delivery services during the week. Reports living with husband and 2 young adult daughters.  ? ?States elevated cholesterol may be genetically influenced. Reports mom has history of hyperlipidemia.  ?States she had knee replacement surgery in 12/2020 and currently working to recovery full strength.  ? ? ?Clinical ?Medical Hx: none reported ?Medications: See list ?Labs: elevated Chol (230), elevated Trg (186), elevated LDL (141) ?Notable Signs/Symptoms: none reported ? ?Lifestyle & Dietary Hx ? ? ?Estimated daily fluid intake: 64 oz ?Supplements: See list ?Sleep: 7-8 hrs/night ?Stress / self-care: gym, walks dog, hangs out with family, massage therapy, glass of wine occasionally ?Current average weekly physical activity: 30 min cardio (bike, elliptical, treadmill) + 30 min of strength training; 3x/week and also aiming for 8000-10000 steps/day; walks dogs 4x/week ? ?24-Hr Dietary Recall ?First Meal: coffee (with creamer) ?First Meal (9 am): 2 eggs or high  protein yogurt  + berries + granola or oatmeal ?Snack (11 am): fruit  ?Second Meal (1-2 pm): leftovers - salad with feta cheese + grilled chicken + vinaigrette or grilled chicken + rice + vegetables ?Snack (5 pm): wheat thins + cheese or handful of nuts ?Third Meal (7 pm): protein + grains + vegetables ?Snack: sometimes frozen yogurt bars or popcorn  ?Beverages: tea, coffee (2*8 oz; 16 oz), water (sometimes flavored; 2-3*16 oz; 32-48 oz), diet Coke (once/week), wine (sometimes once/week) ? ? ?NUTRITION DIAGNOSIS  ?NB-1.1 Food and nutrition-related knowledge deficit As related to elevated triglycerides and LDL Cholesterol.  As evidenced by elevated lipid panel values. ? ? ?NUTRITION INTERVENTION  ?Nutrition education (E-1) on the following topics: Nutrition education and counseling. Pt was educated and counseled on high cholesterol, nutritional ways to lower cholesterol, ways to increase fiber intake, and ways to increase physical activity. Pt was encouraged to continue having 3 meals/day and increasing hydration. Pt agreed with goals listed. ? ?Handouts Provided Include  ?LDL Cholesterol-Lowering Nutrition Therapy ? ?Learning Style & Readiness for Change ?Teaching method utilized: Visual & Auditory  ?Demonstrated degree of understanding via: Teach Back  ?Barriers to learning/adherence to lifestyle change: none identified ? ?Goals Established by Pt ?Aim to to balance breakfast with at least carbohydrates + protein.  ?Aim to increase water intake to at least 4 bottles a day.  ?Aim to choose lower cholesterol options. See handout as reference.  ?Keep up the great work with physical activity and adequate sleep! ? ? ?MONITORING & EVALUATION ?Dietary intake, weekly physical activity. ? ?Next Steps  ?Patient is to follow-up prn. ?

## 2021-12-19 DIAGNOSIS — M1711 Unilateral primary osteoarthritis, right knee: Secondary | ICD-10-CM | POA: Diagnosis not present

## 2022-02-11 ENCOUNTER — Other Ambulatory Visit: Payer: Self-pay | Admitting: Family Medicine

## 2022-02-11 ENCOUNTER — Encounter: Payer: Self-pay | Admitting: Family Medicine

## 2022-02-11 DIAGNOSIS — Z131 Encounter for screening for diabetes mellitus: Secondary | ICD-10-CM

## 2022-02-11 DIAGNOSIS — Z13220 Encounter for screening for lipoid disorders: Secondary | ICD-10-CM

## 2022-02-11 DIAGNOSIS — Z1329 Encounter for screening for other suspected endocrine disorder: Secondary | ICD-10-CM

## 2022-02-11 DIAGNOSIS — Z130 Encounter for screening for diseases of the blood and blood-forming organs and certain disorders involving the immune mechanism: Secondary | ICD-10-CM

## 2022-02-21 NOTE — Progress Notes (Unsigned)
[facility] [address], [place], [place] [address] [phone]  Date:  02/25/2022   Name:  Courtney Spence   DOB:  1965-12-20   MRN:  [id number]  PCP:  Darreld Mclean, MD    Chief Complaint: No chief complaint on file.   History of Present Illness:  Courtney Spence is a 57 y.o. very pleasant female patient who presents with the following:  Pt seen today for CPE Generally in good health Most recent visit with myself about one year ago   Her husband is s/p lung transplant for ILD 2 young adults kids, ages 29 and 56  Mammo, cologuard UTD Pap 2022- negative  Labs one year ago  Never a smoker  There are no problems to display for this patient.   Past Medical History:  Diagnosis Date   Anxiety     No past surgical history on file.  Social History   Tobacco Use   Smoking status: Never   Smokeless tobacco: Never  Vaping Use   Vaping Use: Never used  Substance Use Topics   Alcohol use: Yes   Drug use: No    Family History  Problem Relation Age of Onset   Hyperlipidemia Other    Hypertension Other    Cancer Other     No Known Allergies  Medication list has been reviewed and updated.  Current Outpatient Medications on File Prior to Visit  Medication Sig Dispense Refill   Cholecalciferol (VITAMIN D3) 1.25 MG (50000 UT) CAPS Take 1 weekly for 12 weeks 12 capsule 0   FLUoxetine (PROZAC) 40 MG capsule Take 1 capsule (40 mg total) by mouth daily. 90 capsule 3   levonorgestrel (MIRENA) 20 MCG/24HR IUD by Intrauterine route.     No current facility-administered medications on file prior to visit.    Review of Systems:  As per HPI- otherwise negative.   Physical Examination: There were no vitals filed for this visit. There were no vitals filed for this visit. There is no height or weight on file to calculate BMI. Ideal Body Weight:    GEN: no acute distress. HEENT: Atraumatic,  Normocephalic.  Ears and Nose: No external deformity. CV: RRR, No M/G/R. No JVD. No thrill. No extra heart sounds. PULM: CTA B, no wheezes, crackles, rhonchi. No retractions. No resp. distress. No accessory muscle use. ABD: S, NT, ND, +BS. No rebound. No HSM. EXTR: No c/c/e PSYCH: Normally interactive. Conversant.    Assessment and Plan: *** Physical exam today- encouraged healthy diet and exercise routine  Signed Lamar Blinks, MD

## 2022-02-21 NOTE — Patient Instructions (Incomplete)
Good to see you again- I will be in touch with your labs Recommend covid booster if needed  I will be in touch with your CT coronary and we can decide about cholesterol medication for you

## 2022-02-23 ENCOUNTER — Other Ambulatory Visit (INDEPENDENT_AMBULATORY_CARE_PROVIDER_SITE_OTHER): Payer: BC Managed Care – PPO

## 2022-02-23 ENCOUNTER — Encounter: Payer: Self-pay | Admitting: Family Medicine

## 2022-02-23 DIAGNOSIS — Z13220 Encounter for screening for lipoid disorders: Secondary | ICD-10-CM | POA: Diagnosis not present

## 2022-02-23 DIAGNOSIS — Z1231 Encounter for screening mammogram for malignant neoplasm of breast: Secondary | ICD-10-CM | POA: Diagnosis not present

## 2022-02-23 DIAGNOSIS — Z131 Encounter for screening for diabetes mellitus: Secondary | ICD-10-CM | POA: Diagnosis not present

## 2022-02-23 DIAGNOSIS — Z130 Encounter for screening for diseases of the blood and blood-forming organs and certain disorders involving the immune mechanism: Secondary | ICD-10-CM

## 2022-02-23 DIAGNOSIS — Z1329 Encounter for screening for other suspected endocrine disorder: Secondary | ICD-10-CM

## 2022-02-23 LAB — LIPID PANEL
Cholesterol: 221 mg/dL — ABNORMAL HIGH (ref 0–200)
HDL: 49.8 mg/dL (ref >39.00)
LDL Cholesterol: 145 mg/dL — ABNORMAL HIGH (ref 0–99)
NonHDL: 171.38
Total CHOL/HDL Ratio: 4
Triglycerides: 133 mg/dL (ref 0.0–149.0)
VLDL: 26.6 mg/dL (ref 0.0–40.0)

## 2022-02-23 LAB — HM MAMMOGRAPHY

## 2022-02-23 LAB — COMPREHENSIVE METABOLIC PANEL
ALT: 15 U/L (ref 0–35)
AST: 14 U/L (ref 0–37)
Albumin: 4.1 g/dL (ref 3.5–5.2)
Alkaline Phosphatase: 88 U/L (ref 39–117)
BUN: 18 mg/dL (ref 6–23)
CO2: 27 mEq/L (ref 19–32)
Calcium: 9.3 mg/dL (ref 8.4–10.5)
Chloride: 105 mEq/L (ref 96–112)
Creatinine, Ser: 0.94 mg/dL (ref 0.40–1.20)
GFR: 67.91 mL/min (ref >60.00)
Glucose, Bld: 94 mg/dL (ref 70–99)
Potassium: 4.3 mEq/L (ref 3.5–5.1)
Sodium: 140 mEq/L (ref 135–145)
Total Bilirubin: 0.6 mg/dL (ref 0.2–1.2)
Total Protein: 6.8 g/dL (ref 6.0–8.3)

## 2022-02-23 LAB — TSH: TSH: 3.15 u[IU]/mL (ref 0.35–5.50)

## 2022-02-23 LAB — CBC
HCT: 41.7 % (ref 36.0–46.0)
Hemoglobin: 13.8 g/dL (ref 12.0–15.0)
MCHC: 33 g/dL (ref 30.0–36.0)
MCV: 90.4 fl (ref 78.0–100.0)
RBC: 4.61 Mil/uL (ref 3.87–5.11)
RDW: 13.6 % (ref 11.5–15.5)
WBC: 6.5 10*3/uL (ref 4.0–10.5)

## 2022-02-23 LAB — HEMOGLOBIN A1C: Hgb A1c MFr Bld: 5.7 % (ref 4.6–6.5)

## 2022-02-25 ENCOUNTER — Ambulatory Visit (INDEPENDENT_AMBULATORY_CARE_PROVIDER_SITE_OTHER): Payer: BC Managed Care – PPO | Admitting: Family Medicine

## 2022-02-25 VITALS — BP 134/76 | HR 69 | Temp 98.0°F | Resp 16 | Ht 65.0 in | Wt 215.2 lb

## 2022-02-25 DIAGNOSIS — Z13220 Encounter for screening for lipoid disorders: Secondary | ICD-10-CM | POA: Diagnosis not present

## 2022-02-25 DIAGNOSIS — Z131 Encounter for screening for diabetes mellitus: Secondary | ICD-10-CM

## 2022-02-25 DIAGNOSIS — Z0000 Encounter for general adult medical examination without abnormal findings: Secondary | ICD-10-CM

## 2022-02-25 DIAGNOSIS — Z130 Encounter for screening for diseases of the blood and blood-forming organs and certain disorders involving the immune mechanism: Secondary | ICD-10-CM

## 2022-02-25 DIAGNOSIS — E559 Vitamin D deficiency, unspecified: Secondary | ICD-10-CM

## 2022-02-25 DIAGNOSIS — Z1329 Encounter for screening for other suspected endocrine disorder: Secondary | ICD-10-CM | POA: Diagnosis not present

## 2022-02-25 DIAGNOSIS — E785 Hyperlipidemia, unspecified: Secondary | ICD-10-CM

## 2022-02-25 DIAGNOSIS — F418 Other specified anxiety disorders: Secondary | ICD-10-CM

## 2022-02-25 MED ORDER — FLUOXETINE HCL 40 MG PO CAPS: 40.0000 mg | ORAL_CAPSULE | Freq: Every day | ORAL | 3 refills | Status: DC

## 2022-03-02 ENCOUNTER — Encounter: Payer: BC Managed Care – PPO | Admitting: Family Medicine

## 2022-03-10 ENCOUNTER — Ambulatory Visit (HOSPITAL_BASED_OUTPATIENT_CLINIC_OR_DEPARTMENT_OTHER)
Admission: RE | Admit: 2022-03-10 | Discharge: 2022-03-10 | Disposition: A | Discharge status: Home / Self Care | Payer: BC Managed Care – PPO | Source: Ambulatory Visit | Attending: Family Medicine | Admitting: Family Medicine

## 2022-03-10 ENCOUNTER — Encounter: Payer: Self-pay | Admitting: Family Medicine

## 2022-03-10 DIAGNOSIS — E785 Hyperlipidemia, unspecified: Secondary | ICD-10-CM

## 2022-03-10 DIAGNOSIS — R928 Other abnormal and inconclusive findings on diagnostic imaging of breast: Secondary | ICD-10-CM | POA: Diagnosis not present

## 2022-03-11 MED ORDER — ROSUVASTATIN CALCIUM 20 MG PO TABS: 20.0000 mg | ORAL_TABLET | Freq: Every day | ORAL | 3 refills | Status: DC

## 2022-05-27 ENCOUNTER — Encounter: Payer: Self-pay | Admitting: Family Medicine

## 2022-05-27 DIAGNOSIS — E785 Hyperlipidemia, unspecified: Secondary | ICD-10-CM

## 2022-06-12 ENCOUNTER — Encounter: Payer: Self-pay | Admitting: Family Medicine

## 2022-06-12 ENCOUNTER — Other Ambulatory Visit (INDEPENDENT_AMBULATORY_CARE_PROVIDER_SITE_OTHER): Payer: BC Managed Care – PPO

## 2022-06-12 DIAGNOSIS — E785 Hyperlipidemia, unspecified: Secondary | ICD-10-CM

## 2022-06-12 LAB — LIPID PANEL
Cholesterol: 131 mg/dL (ref 0–200)
HDL: 52.6 mg/dL (ref >39.00)
LDL Cholesterol: 59 mg/dL (ref 0–99)
NonHDL: 78.48
Total CHOL/HDL Ratio: 2
Triglycerides: 96 mg/dL (ref 0.0–149.0)
VLDL: 19.2 mg/dL (ref 0.0–40.0)

## 2022-08-05 DIAGNOSIS — M25562 Pain in left knee: Secondary | ICD-10-CM | POA: Diagnosis not present

## 2022-09-04 DIAGNOSIS — M1712 Unilateral primary osteoarthritis, left knee: Secondary | ICD-10-CM | POA: Diagnosis not present

## 2022-11-04 DIAGNOSIS — M1712 Unilateral primary osteoarthritis, left knee: Secondary | ICD-10-CM | POA: Diagnosis not present

## 2022-11-11 ENCOUNTER — Encounter: Payer: Self-pay | Admitting: Family Medicine

## 2022-11-15 NOTE — Progress Notes (Unsigned)
[facility] at [facility] [address], [address] [place], [place] [address] [phone]  Date:  11/16/2022   Name:  Courtney Spence   DOB:  05-20-1965   MRN:  [id number]  PCP:  Pearline Cables, MD    Chief Complaint: Pre-op Exam (No other concerns/questions/Received flu shot already)   History of Present Illness:  Courtney Spence is a 57 y.o. very pleasant female patient who presents with the following:  Pt seen today for pre-op assessment prior to LEFT TKA surgery per Dr Yisroel Ramming Last seen by myself in February for her CPE She is generally in good health   We did a coronary calcium earlier this year- 91st% with a score of 64.  We added crestor at that time   Needs EKG update Per [facility] they will take care of ordering her labs   She has her right knee done 2 years ago and did well  She had a very good result from her surgery and is eager to get her LEFT knee done as well   Surgery will be scheduled asap - hopefully December  She is physically active with no CP or SOB She goes to the gym 3x a week- she enjoys the elliptical for  20  minutes or walking - maybe 3-5 miles walking  She can climb 2 flights of steps without chest pain of SOB- just has knee pain!  No blood thinners   Prozac Crestor Mirena IUD  Flu vaccine: done already   There are no problems to display for this patient.   Past Medical History:  Diagnosis Date   Anxiety     No past surgical history on file.  Social History   Tobacco Use   Smoking status: Never   Smokeless tobacco: Never  Vaping Use   Vaping status: Never Used  Substance Use Topics   Alcohol use: Yes   Drug use: No    Family History  Problem Relation Age of Onset   Hyperlipidemia Other    Hypertension Other    Cancer Other     No Known Allergies  Medication list has been reviewed and updated.  Current Outpatient Medications on File Prior to Visit  Medication Sig Dispense  Refill   Cholecalciferol (VITAMIN D3) 1.25 MG (50000 UT) CAPS Take 1 weekly for 12 weeks 12 capsule 0   FLUoxetine (PROZAC) 40 MG capsule Take 1 capsule (40 mg total) by mouth daily. 90 capsule 3   levonorgestrel (MIRENA) 20 MCG/24HR IUD by Intrauterine route.     rosuvastatin (CRESTOR) 20 MG tablet Take 1 tablet (20 mg total) by mouth daily. 90 tablet 3   No current facility-administered medications on file prior to visit.    Review of Systems:  As per HPI- otherwise negative.   Physical Examination: Vitals:   11/16/22 1328  BP: 122/80  Pulse: 67  Resp: 18  Temp: 98 F (36.7 C)  SpO2: 98%   Vitals:   11/16/22 1328  Weight: 207 lb 6.4 oz (94.1 kg)  Height: 5\' 5"  (1.651 m)   Body mass index is 34.51 kg/m. Ideal Body Weight: Weight in (lb) to have BMI = 25: 149.9  GEN: no acute distress.  Mild obesity, looks well  HEENT: Atraumatic, Normocephalic.  Bilateral TM wnl, oropharynx normal.  PEERL,EOMI.   Ears and Nose: No external deformity. CV: RRR, No M/G/R. No JVD. No thrill. No extra heart sounds. PULM: CTA B, no wheezes, crackles, rhonchi.  No retractions. No resp. distress. No accessory muscle use. ABD: S, NT, ND. No rebound. No HSM. EXTR: No c/c/e PSYCH: Normally interactive. Conversant.   EKG: SR with minimal benign change compared with 2022 Assessment and Plan: Preoperative cardiovascular examination - Plan: EKG 12-Lead  Cleared for surgery, will fax form to [facility]o plans to do labs  She will let me know if any change in her sx in the interim  Signed Abbe Amsterdam, MD

## 2022-11-15 NOTE — Patient Instructions (Signed)
It was great to see you again today!  

## 2022-11-16 ENCOUNTER — Ambulatory Visit (INDEPENDENT_AMBULATORY_CARE_PROVIDER_SITE_OTHER): Payer: BC Managed Care – PPO | Admitting: Family Medicine

## 2022-11-16 VITALS — BP 122/80 | HR 67 | Temp 98.0°F | Resp 18 | Ht 65.0 in | Wt 207.4 lb

## 2022-11-16 DIAGNOSIS — Z01810 Encounter for preprocedural cardiovascular examination: Secondary | ICD-10-CM

## 2022-11-30 IMAGING — CT CT CHEST W/O CM
2 of 3 series · 15 of 36 positions shown, 18 images · non-contrast
Comparison: September 06, 2020.

CLINICAL DATA: Pneumonia, abnormal chest x-ray.

EXAM:
CT CHEST WITHOUT CONTRAST
TECHNIQUE: Multidetector CT imaging of the chest was performed following the
standard protocol without IV contrast.

[Series 2: thorax · axial · 0.77mm/px · z∈[-273,-41]mm · 12 of 138 slices shown, 15 images]
[im 11/138  mediastinal]
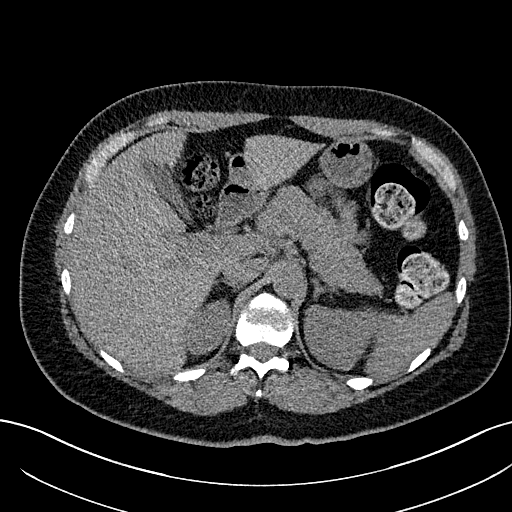
[im 11/138  lung]
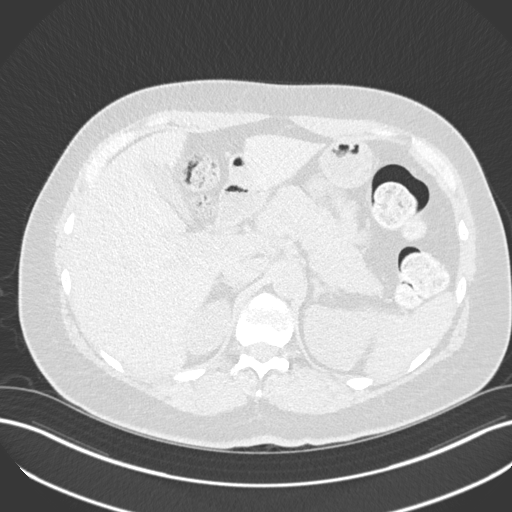
[im 21/138  lung]
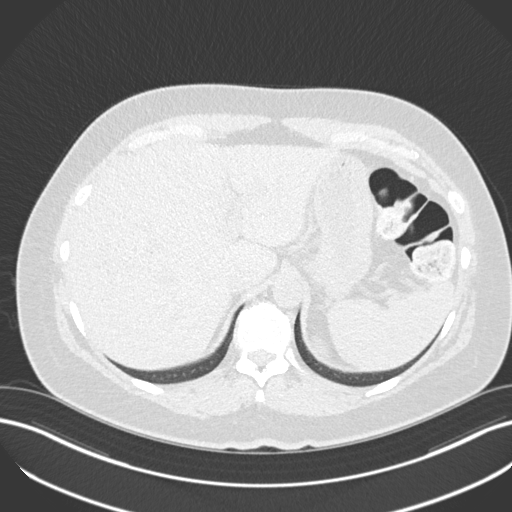
[im 31/138  lung]
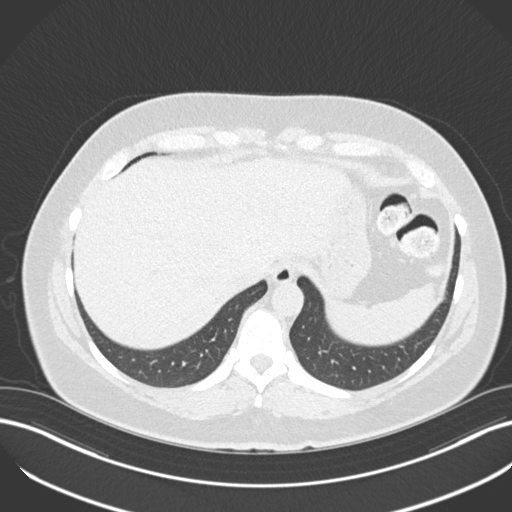
[im 41/138  lung]
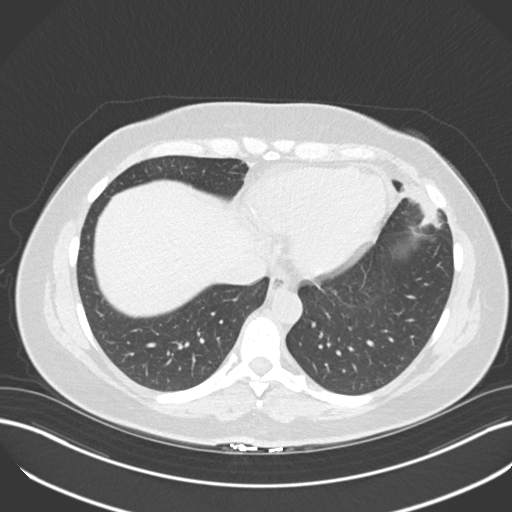
[im 51/138  mediastinal]
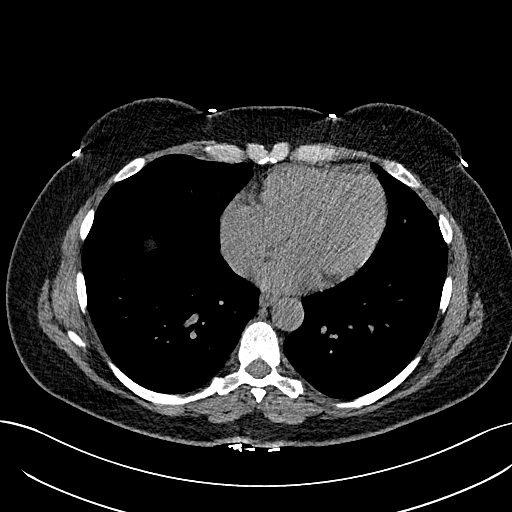
[im 51/138  lung]
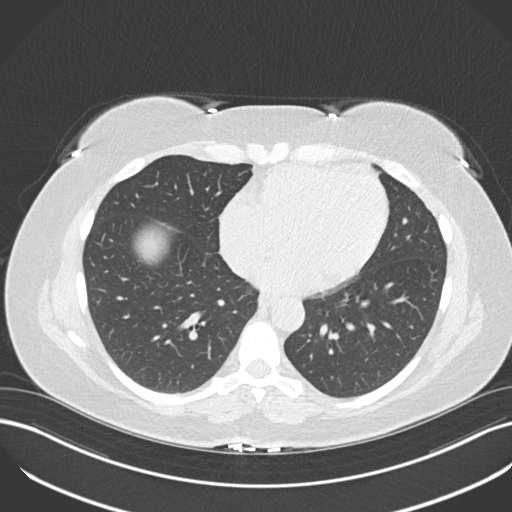
[im 61/138  lung]
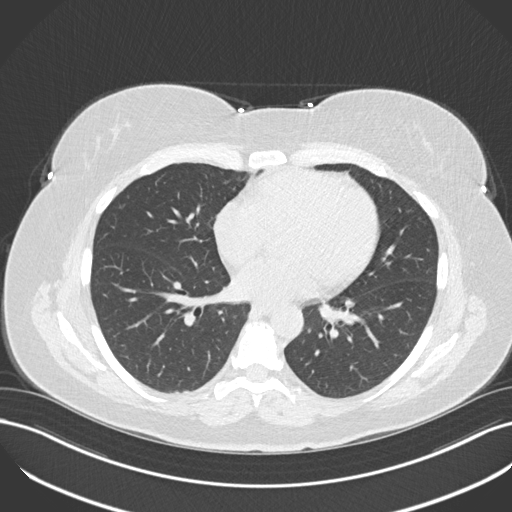
[im 77/138  lung]
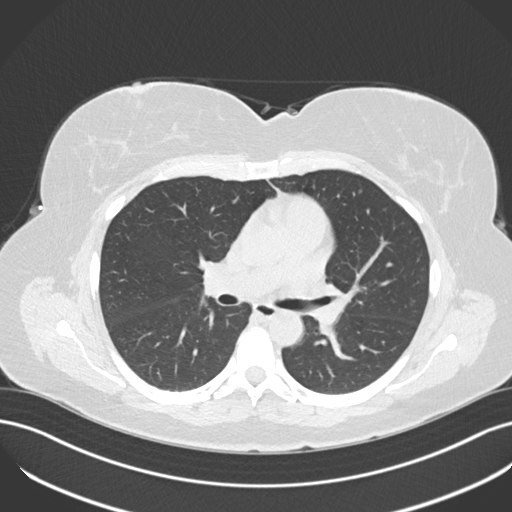
[im 87/138  lung]
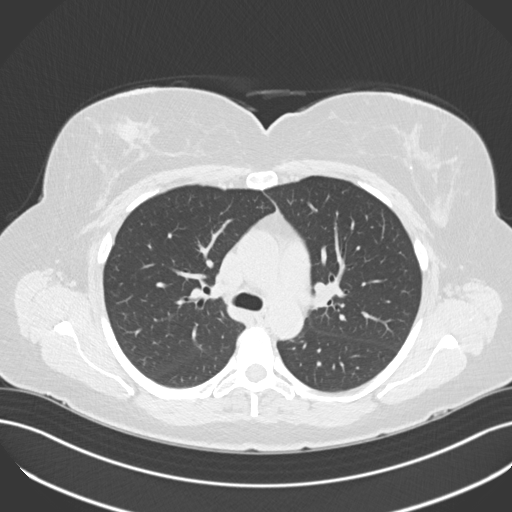
[im 97/138  mediastinal]
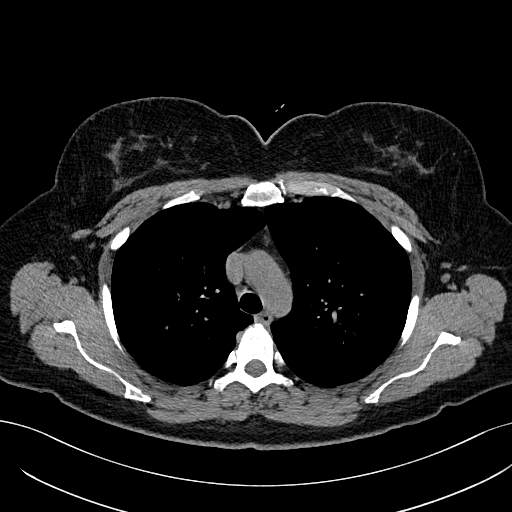
[im 97/138  lung]
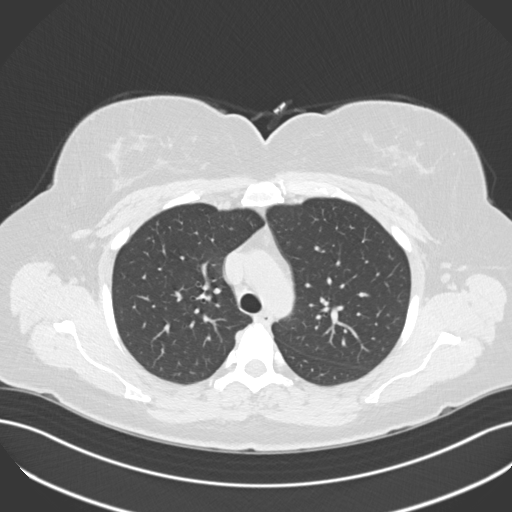
[im 107/138  lung]
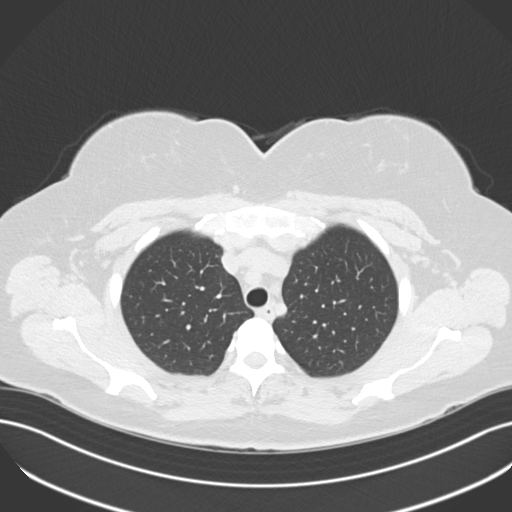
[im 117/138  lung]
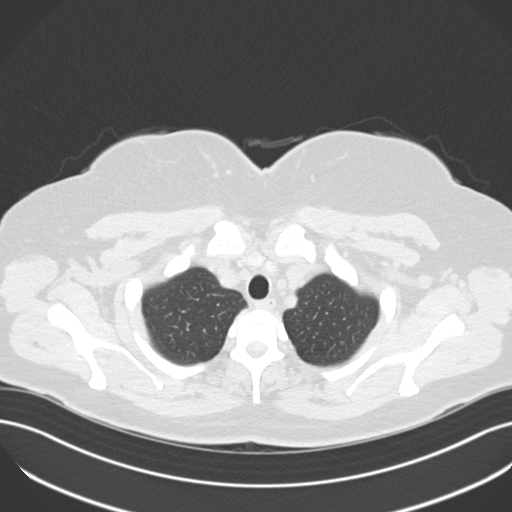
[im 127/138  lung]
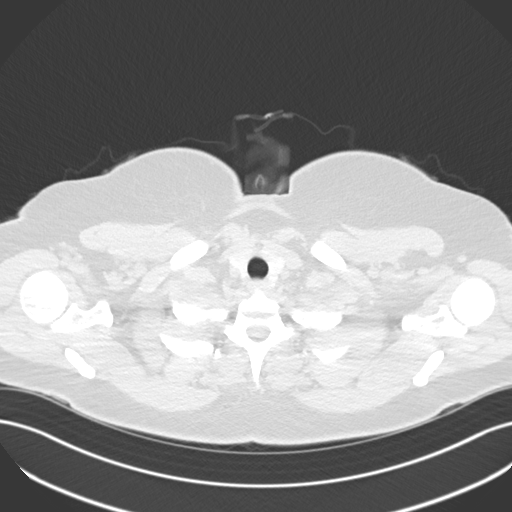

[Series 5: coronal · coronal · 0.57mm/px · 3 of 145 slices shown]
[im 29/145  lung]
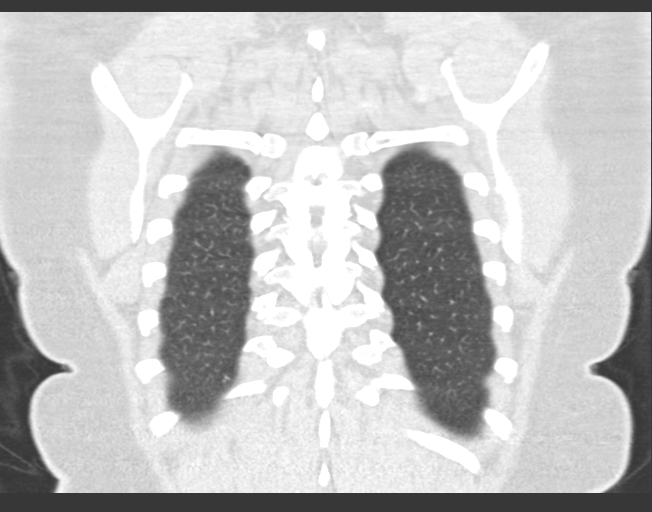
[im 58/145  lung]
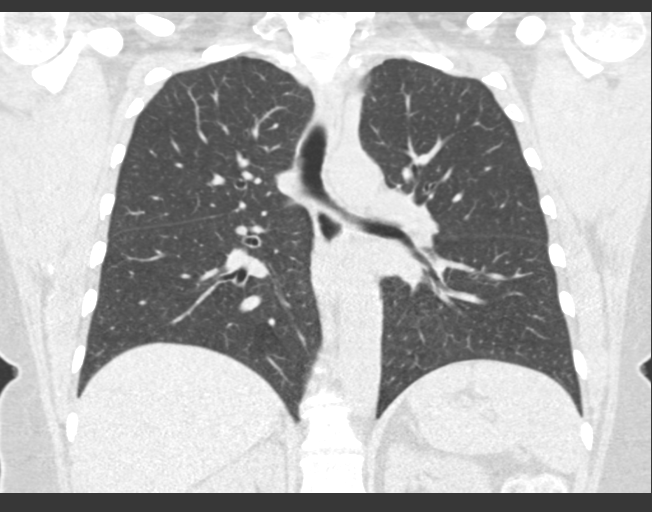
[im 87/145  lung]
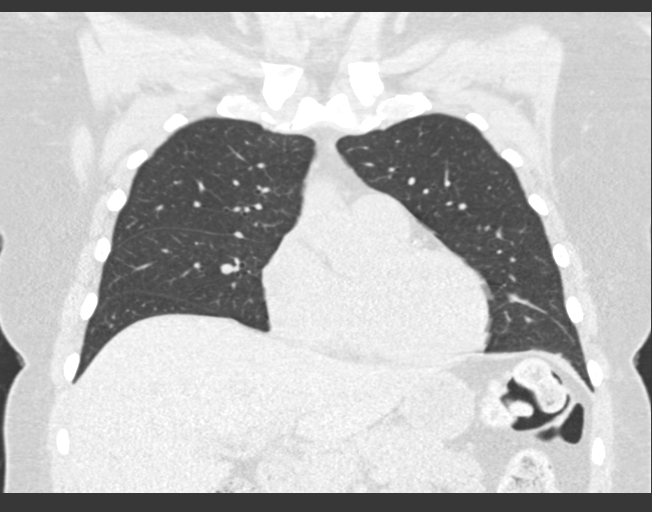

[15 of 36 positions shown; findings below may reference images not displayed]

FINDINGS: Cardiovascular: No significant vascular findings. Normal heart size.
No pericardial effusion.

Mediastinum/Nodes: No enlarged mediastinal or axillary lymph nodes.
Thyroid gland, trachea, and esophagus demonstrate no significant
findings.

Lungs/Pleura: No pneumothorax or pleural effusion is noted. Right
lung is clear. Decreased opacity is seen in inferior portion of
lingular segment of left upper lobe most consistent with improving
atelectasis or postinfectious scarring.

Upper Abdomen: No acute abnormality.

Musculoskeletal: No chest wall mass or suspicious bone lesions
identified.
IMPRESSION: Left lingular opacity noted on prior exam is significantly decreased
currently, most consistent with improving atelectasis or possibly
postinfectious scarring.

## 2022-12-07 DIAGNOSIS — M1712 Unilateral primary osteoarthritis, left knee: Secondary | ICD-10-CM | POA: Diagnosis not present

## 2022-12-07 DIAGNOSIS — M25562 Pain in left knee: Secondary | ICD-10-CM | POA: Diagnosis not present

## 2022-12-09 ENCOUNTER — Encounter: Payer: Self-pay | Admitting: Family Medicine

## 2022-12-09 DIAGNOSIS — D696 Thrombocytopenia, unspecified: Secondary | ICD-10-CM

## 2022-12-09 DIAGNOSIS — R7401 Elevation of levels of liver transaminase levels: Secondary | ICD-10-CM

## 2022-12-11 ENCOUNTER — Other Ambulatory Visit (INDEPENDENT_AMBULATORY_CARE_PROVIDER_SITE_OTHER): Payer: BC Managed Care – PPO

## 2022-12-11 DIAGNOSIS — D696 Thrombocytopenia, unspecified: Secondary | ICD-10-CM

## 2022-12-11 DIAGNOSIS — R7401 Elevation of levels of liver transaminase levels: Secondary | ICD-10-CM

## 2022-12-11 NOTE — Addendum Note (Signed)
Addended by: Mervin Kung A on: 12/11/2022 03:02 PM   Modules accepted: Orders

## 2022-12-12 LAB — CBC
HCT: 40.3 % (ref 35.0–45.0)
Hemoglobin: 13.3 g/dL (ref 11.7–15.5)
MCH: 30.9 pg (ref 27.0–33.0)
MCHC: 33 g/dL (ref 32.0–36.0)
MCV: 93.5 fL (ref 80.0–100.0)
MPV: 11.1 fL (ref 7.5–12.5)
Platelets: 140 10*3/uL (ref 140–400)
RBC: 4.31 10*6/uL (ref 3.80–5.10)
RDW: 12.3 % (ref 11.0–15.0)
WBC: 6.8 10*3/uL (ref 3.8–10.8)

## 2022-12-12 LAB — HEPATIC FUNCTION PANEL
AG Ratio: 1.8 (calc) (ref 1.0–2.5)
ALT: 39 U/L — ABNORMAL HIGH (ref 6–29)
AST: 24 U/L (ref 10–35)
Albumin: 4.5 g/dL (ref 3.6–5.1)
Alkaline phosphatase (APISO): 80 U/L (ref 37–153)
Bilirubin, Direct: 0.1 mg/dL (ref 0.0–0.2)
Globulin: 2.5 g/dL (ref 1.9–3.7)
Indirect Bilirubin: 0.2 mg/dL (ref 0.2–1.2)
Total Bilirubin: 0.3 mg/dL (ref 0.2–1.2)
Total Protein: 7 g/dL (ref 6.1–8.1)

## 2022-12-24 DIAGNOSIS — M1712 Unilateral primary osteoarthritis, left knee: Secondary | ICD-10-CM | POA: Diagnosis not present

## 2023-02-03 ENCOUNTER — Encounter: Payer: BC Managed Care – PPO | Admitting: Family Medicine

## 2023-02-25 NOTE — Patient Instructions (Addendum)
 It was great to see you today, I will be in touch with your labs soon as possible  Recommend COVID booster if none the last 6 months or so  Stop by GYN on the way out if you like and set up an appt to have your IUD removed  Cologuard will come to your home

## 2023-02-25 NOTE — Progress Notes (Addendum)
 [facility] at [facility] [address] [place], [place] [address] [phone]  Date:  03/01/2023   Name:  Courtney Spence   DOB:  1965-11-14   MRN:  [id number]  PCP:  Pearline Cables, MD    Chief Complaint: Annual Exam   History of Present Illness:  Courtney Spence is a 58 y.o. very pleasant female patient who presents with the following:  Patient seen today for physical exam Most recent visit with myself was a preoperative visit in November at which time she was planning a left knee replacement.  She is about 10 weeks out from surgery She is now on a home exercise PT program, her daughter is helping her.  She is doing well with her knee   Elevated coronary calcium percentile last year-now taking Crestor  Mammogram-due next month- scheduled  Pap smear 2022, negative- will update today  Colon cancer screening-Cologuard due for update, ordered today Recommend COVID booster Some labs done in December-hepatic function, CBC only ALT was minimally elevated in December, follow-up today  Prozac 40 Crestor 20 Mirena IUD- placed in 2017, pt would like removed at this time  There are no active problems to display for this patient.   Past Medical History:  Diagnosis Date   Anxiety     No past surgical history on file.  Social History   Tobacco Use   Smoking status: Never   Smokeless tobacco: Never  Vaping Use   Vaping status: Never Used  Substance Use Topics   Alcohol use: Yes   Drug use: No    Family History  Problem Relation Age of Onset   Hyperlipidemia Other    Hypertension Other    Cancer Other     No Known Allergies  Medication list has been reviewed and updated.  Current Outpatient Medications on File Prior to Visit  Medication Sig Dispense Refill   Cholecalciferol (VITAMIN D3) 1.25 MG (50000 UT) CAPS Take 1 weekly for 12 weeks 12 capsule 0   FLUoxetine (PROZAC) 40 MG capsule Take 1 capsule (40 mg total)  by mouth daily. 90 capsule 3   levonorgestrel (MIRENA) 20 MCG/24HR IUD by Intrauterine route.     rosuvastatin (CRESTOR) 20 MG tablet Take 1 tablet (20 mg total) by mouth daily. 90 tablet 3   No current facility-administered medications on file prior to visit.    Review of Systems:  As per HPI- otherwise negative.   Physical Examination: Vitals:   03/01/23 0906  BP: 132/84  Pulse: 87  SpO2: 97%   Vitals:   03/01/23 0906  Weight: 210 lb 6.4 oz (95.4 kg)  Height: 5\' 5"  (1.651 m)   Body mass index is 35.01 kg/m. Ideal Body Weight: Weight in (lb) to have BMI = 25: 149.9  GEN: no acute distress.  Mild obesity, looks well  HEENT: Atraumatic, Normocephalic. Bilateral TM wnl, oropharynx normal.  PEERL,EOMI.   Ears and Nose: No external deformity. CV: RRR, No M/G/R. No JVD. No thrill. No extra heart sounds. PULM: CTA B, no wheezes, crackles, rhonchi. No retractions. No resp. distress. No accessory muscle use. ABD: S, NT, ND, +BS. No rebound. No HSM. EXTR: No c/c/e PSYCH: Normally interactive. Conversant.  Vulva, vagina, cervix all normal.  IUD strings visible  Colleted pap    Assessment and Plan: Physical exam  Dyslipidemia - Plan: Lipid panel, rosuvastatin (CRESTOR) 20 MG tablet  Screening for diabetes mellitus - Plan: Hemoglobin A1c, Comprehensive metabolic panel  Screening for thyroid disorder - Plan: TSH  Screening for colon cancer - Plan: Cologuard  Screening for cervical cancer - Plan: Cytology - PAP  IUD check up - Plan: Ambulatory referral to Obstetrics / Gynecology  Situational anxiety - Plan: FLUoxetine (PROZAC) 40 MG capsule  Physical exam today.  Encouraged healthy diet and exercise routine Will plan further follow- up pending labs. Doing well with her medications, refilled today Time for IUD to be removed but I do not have the proper tool.  Referral to GYN to have this done   Signed Abbe Amsterdam, MD  Received labs, message to pt  Results for  orders placed or performed in visit on 03/01/23  Hemoglobin A1c   Collection Time: 03/01/23  9:36 AM  Result Value Ref Range   Hgb A1c MFr Bld 5.7 4.6 - 6.5 %  Lipid panel   Collection Time: 03/01/23  9:36 AM  Result Value Ref Range   Cholesterol 131 0 - 200 mg/dL   Triglycerides 454.0 0.0 - 149.0 mg/dL   HDL 98.11 >91.47 mg/dL   VLDL 82.9 0.0 - 56.2 mg/dL   LDL Cholesterol 51 0 - 99 mg/dL   Total CHOL/HDL Ratio 2    NonHDL 73.97   TSH   Collection Time: 03/01/23  9:36 AM  Result Value Ref Range   TSH 2.30 0.35 - 5.50 uIU/mL  Comprehensive metabolic panel   Collection Time: 03/01/23  9:36 AM  Result Value Ref Range   Sodium 140 135 - 145 mEq/L   Potassium 4.6 3.5 - 5.1 mEq/L   Chloride 104 96 - 112 mEq/L   CO2 28 19 - 32 mEq/L   Glucose, Bld 80 70 - 99 mg/dL   BUN 23 6 - 23 mg/dL   Creatinine, Ser 1.30 0.40 - 1.20 mg/dL   Total Bilirubin 0.6 0.2 - 1.2 mg/dL   Alkaline Phosphatase 85 39 - 117 U/L   AST 17 0 - 37 U/L   ALT 20 0 - 35 U/L   Total Protein 7.0 6.0 - 8.3 g/dL   Albumin 4.5 3.5 - 5.2 g/dL   GFR 86.57 >84.69 mL/min   Calcium 9.4 8.4 - 10.5 mg/dL

## 2023-02-28 ENCOUNTER — Other Ambulatory Visit: Payer: Self-pay | Admitting: Family Medicine

## 2023-02-28 DIAGNOSIS — F418 Other specified anxiety disorders: Secondary | ICD-10-CM

## 2023-03-01 ENCOUNTER — Ambulatory Visit (INDEPENDENT_AMBULATORY_CARE_PROVIDER_SITE_OTHER): Payer: BC Managed Care – PPO | Admitting: Family Medicine

## 2023-03-01 ENCOUNTER — Encounter: Payer: Self-pay | Admitting: Family Medicine

## 2023-03-01 ENCOUNTER — Other Ambulatory Visit (HOSPITAL_COMMUNITY)
Admission: RE | Admit: 2023-03-01 | Discharge: 2023-03-01 | Disposition: A | Discharge status: Home / Self Care | Payer: BC Managed Care – PPO | Source: Ambulatory Visit | Attending: Family Medicine | Admitting: Family Medicine

## 2023-03-01 VITALS — BP 132/84 | HR 87 | Ht 65.0 in | Wt 210.4 lb

## 2023-03-01 DIAGNOSIS — Z131 Encounter for screening for diabetes mellitus: Secondary | ICD-10-CM

## 2023-03-01 DIAGNOSIS — Z1211 Encounter for screening for malignant neoplasm of colon: Secondary | ICD-10-CM

## 2023-03-01 DIAGNOSIS — E669 Obesity, unspecified: Secondary | ICD-10-CM

## 2023-03-01 DIAGNOSIS — Z1329 Encounter for screening for other suspected endocrine disorder: Secondary | ICD-10-CM

## 2023-03-01 DIAGNOSIS — Z30431 Encounter for routine checking of intrauterine contraceptive device: Secondary | ICD-10-CM

## 2023-03-01 DIAGNOSIS — Z124 Encounter for screening for malignant neoplasm of cervix: Secondary | ICD-10-CM | POA: Insufficient documentation

## 2023-03-01 DIAGNOSIS — E785 Hyperlipidemia, unspecified: Secondary | ICD-10-CM

## 2023-03-01 DIAGNOSIS — Z0000 Encounter for general adult medical examination without abnormal findings: Secondary | ICD-10-CM | POA: Diagnosis not present

## 2023-03-01 DIAGNOSIS — F418 Other specified anxiety disorders: Secondary | ICD-10-CM

## 2023-03-01 LAB — COMPREHENSIVE METABOLIC PANEL
ALT: 20 U/L (ref 0–35)
AST: 17 U/L (ref 0–37)
Albumin: 4.5 g/dL (ref 3.5–5.2)
Alkaline Phosphatase: 85 U/L (ref 39–117)
BUN: 23 mg/dL (ref 6–23)
CO2: 28 meq/L (ref 19–32)
Calcium: 9.4 mg/dL (ref 8.4–10.5)
Chloride: 104 meq/L (ref 96–112)
Creatinine, Ser: 0.81 mg/dL (ref 0.40–1.20)
GFR: 80.61 mL/min (ref >60.00)
Glucose, Bld: 80 mg/dL (ref 70–99)
Potassium: 4.6 meq/L (ref 3.5–5.1)
Sodium: 140 meq/L (ref 135–145)
Total Bilirubin: 0.6 mg/dL (ref 0.2–1.2)
Total Protein: 7 g/dL (ref 6.0–8.3)

## 2023-03-01 LAB — HEMOGLOBIN A1C: Hgb A1c MFr Bld: 5.7 % (ref 4.6–6.5)

## 2023-03-01 LAB — LIPID PANEL
Cholesterol: 131 mg/dL (ref 0–200)
HDL: 57.4 mg/dL (ref >39.00)
LDL Cholesterol: 51 mg/dL (ref 0–99)
NonHDL: 73.97
Total CHOL/HDL Ratio: 2
Triglycerides: 115 mg/dL (ref 0.0–149.0)
VLDL: 23 mg/dL (ref 0.0–40.0)

## 2023-03-01 LAB — TSH: TSH: 2.3 u[IU]/mL (ref 0.35–5.50)

## 2023-03-01 MED ORDER — FLUOXETINE HCL 40 MG PO CAPS: 40.0000 mg | ORAL_CAPSULE | Freq: Every day | ORAL | 3 refills | Status: AC

## 2023-03-01 MED ORDER — ROSUVASTATIN CALCIUM 20 MG PO TABS: 20.0000 mg | ORAL_TABLET | Freq: Every day | ORAL | 3 refills | Status: AC

## 2023-03-02 ENCOUNTER — Encounter: Payer: Self-pay | Admitting: Family Medicine

## 2023-03-02 LAB — CYTOLOGY - PAP
Comment: NEGATIVE
Diagnosis: NEGATIVE
High risk HPV: NEGATIVE

## 2023-03-09 MED ORDER — TIRZEPATIDE-WEIGHT MANAGEMENT 2.5 MG/0.5ML ~~LOC~~ SOLN: 2.5000 mg | SUBCUTANEOUS | 1 refills | Status: AC

## 2023-03-09 NOTE — Addendum Note (Signed)
 Addended by: Abbe Amsterdam C on: 03/09/2023 02:21 PM   Modules accepted: Orders

## 2023-03-18 DIAGNOSIS — Z1211 Encounter for screening for malignant neoplasm of colon: Secondary | ICD-10-CM | POA: Diagnosis not present

## 2023-03-25 ENCOUNTER — Encounter: Payer: Self-pay | Admitting: Family Medicine

## 2023-03-25 LAB — COLOGUARD: COLOGUARD: NEGATIVE

## 2023-03-29 ENCOUNTER — Telehealth: Payer: Self-pay

## 2023-03-29 NOTE — Telephone Encounter (Signed)
 Returned patients phone call about scheduling an IUD removal per a referral from Dr. Patsy Lager. Scheduled patient for 4/11 with Dr. Shea Evans.

## 2023-04-16 ENCOUNTER — Ambulatory Visit: Admitting: Obstetrics and Gynecology

## 2023-04-16 VITALS — BP 120/79 | HR 71 | Ht 65.0 in | Wt 212.0 lb

## 2023-04-16 DIAGNOSIS — Z30432 Encounter for removal of intrauterine contraceptive device: Secondary | ICD-10-CM | POA: Diagnosis not present

## 2023-04-16 NOTE — Progress Notes (Signed)
 NEW GYNECOLOGY VISIT Chief Complaint  Patient presents with   Gynecologic Exam    IUD removal     Subjective:  Rotunda Guinea-Bissau is a 58 y.o. who presents for IUD removal  Mirena IUD for many years No periods x 3-4 years Has had hot flashes/night sweats   Had annual with PCP in February Last pap: 2025 NILM/HPV neg Last mammo: scheduled Last colonoscopy: cologuard neg 02/2023      Past Medical History:  Diagnosis Date   Anxiety     No past surgical history on file.  Social History   Socioeconomic History   Marital status: Married    Spouse name: Not on file   Number of children: Not on file   Years of education: Not on file   Highest education level: Bachelor's degree (e.g., BA, AB, BS)  Occupational History   Not on file  Tobacco Use   Smoking status: Never   Smokeless tobacco: Never  Vaping Use   Vaping status: Never Used  Substance and Sexual Activity   Alcohol use: Yes   Drug use: No   Sexual activity: Yes  Other Topics Concern   Not on file  Social History Narrative   Not on file   Social Drivers of Health   Financial Resource Strain: Low Risk  (03/01/2023)   Overall Financial Resource Strain (CARDIA)    Difficulty of Paying Living Expenses: Not hard at all  Food Insecurity: No Food Insecurity (03/01/2023)   Hunger Vital Sign    Worried About Running Out of Food in the Last Year: Never true    Ran Out of Food in the Last Year: Never true  Transportation Needs: No Transportation Needs (03/01/2023)   PRAPARE - Administrator, Civil Service (Medical): No    Lack of Transportation (Non-Medical): No  Physical Activity: Sufficiently Active (03/01/2023)   Exercise Vital Sign    Days of Exercise per Week: 3 days    Minutes of Exercise per Session: 60 min  Stress: No Stress Concern Present (03/01/2023)   [employer] of Occupational Health - Occupational Stress Questionnaire    Feeling of Stress : Only a little  Social Connections:  Moderately Integrated (03/01/2023)   Social Connection and Isolation Panel [NHANES]    Frequency of Communication with Friends and Family: Once a week    Frequency of Social Gatherings with Friends and Family: Once a week    Attends Religious Services: 1 to 4 times per year    Active Member of [employer] or Organizations: Yes    Attends Engineer, structural: More than 4 times per year    Marital Status: Married    Family History  Problem Relation Age of Onset   Hyperlipidemia Other    Hypertension Other    Cancer Other     Current Outpatient Medications on File Prior to Visit  Medication Sig Dispense Refill   Cholecalciferol (VITAMIN D3) 1.25 MG (50000 UT) CAPS Take 1 weekly for 12 weeks 12 capsule 0   FLUoxetine (PROZAC) 40 MG capsule Take 1 capsule (40 mg total) by mouth daily. 90 capsule 3   rosuvastatin (CRESTOR) 20 MG tablet Take 1 tablet (20 mg total) by mouth daily. 90 tablet 3   levonorgestrel (MIRENA) 20 MCG/24HR IUD by Intrauterine route. (Patient not taking: Reported on 04/16/2023)     tirzepatide (ZEPBOUND) 2.5 MG/0.5ML injection vial Inject 2.5 mg into the skin once a week. (Patient not taking: Reported on 04/16/2023) 2 mL 1  No current facility-administered medications on file prior to visit.    No Known Allergies   Objective:   Vitals:   04/16/23 1028  BP: 120/79  Pulse: 71  Weight: 212 lb (96.2 kg)  Height: 5\' 5"  (1.651 m)   Physical Examination:   General appearance - well appearing, and in no distress  Mental status - alert, oriented to person, place, and time  Psych:  normal mood and affect  Abdomen - soft, nontender, nondistended, no masses or organomegaly  Pelvic -  VULVA: normal appearing vulva with no masses, tenderness or lesions   VAGINA: normal appearing vagina with normal color and discharge, no lesions   CERVIX: normal appearing cervix without discharge or lesions, +IUD strings  Extremities:  No swelling or varicosities noted   IUD  Removal  Patient identified, informed consent performed, consent signed.  Patient was in the dorsal lithotomy position, normal external genitalia was noted.  A speculum was placed in the patient's vagina, normal discharge was noted, no lesions. The cervix was visualized, no lesions, no abnormal discharge.  The strings of the IUD were grasped and pulled using ring forceps. The IUD was removed in its entirety.   Patient tolerated the procedure well.    Chaperone present for exam  Assessment and Plan:  Catha Guinea-Bissau is a 58 y.o. for IUD removal  Uncomplicated removal Annual is up to date F/u prn   No follow-ups on file.  No future appointments.  Wanita Chamberlain, MD, FACOG Obstetrician & Gynecologist, [facility] for [facility], [facility]

## 2023-04-21 DIAGNOSIS — Z1231 Encounter for screening mammogram for malignant neoplasm of breast: Secondary | ICD-10-CM | POA: Diagnosis not present

## 2023-04-21 LAB — HM MAMMOGRAPHY

## 2023-10-14 ENCOUNTER — Encounter: Payer: Self-pay | Admitting: Family Medicine

## 2024-03-01 ENCOUNTER — Encounter: Admitting: Family Medicine
